# Patient Record
Sex: Male | Born: 1970 | Race: Black or African American | Hispanic: No | Marital: Married | Smoking: Never smoker
Health system: Southern US, Community
[De-identification: ages and names within clinical notes are randomized; demographics above are authoritative.]

## PROBLEM LIST (undated history)

## (undated) DIAGNOSIS — G4733 Obstructive sleep apnea (adult) (pediatric): Secondary | ICD-10-CM

## (undated) DIAGNOSIS — J45909 Unspecified asthma, uncomplicated: Secondary | ICD-10-CM

## (undated) DIAGNOSIS — I10 Essential (primary) hypertension: Secondary | ICD-10-CM

## (undated) DIAGNOSIS — K219 Gastro-esophageal reflux disease without esophagitis: Secondary | ICD-10-CM

## (undated) HISTORY — DX: Gastro-esophageal reflux disease without esophagitis: K21.9

## (undated) HISTORY — DX: Obstructive sleep apnea (adult) (pediatric): G47.33

## (undated) HISTORY — DX: Unspecified asthma, uncomplicated: J45.909

## (undated) HISTORY — PX: MOUTH SURGERY: SHX715

## (undated) HISTORY — PX: HEMORROIDECTOMY: SUR656

---

## 2013-09-24 ENCOUNTER — Ambulatory Visit (INDEPENDENT_AMBULATORY_CARE_PROVIDER_SITE_OTHER): Payer: Managed Care, Other (non HMO)

## 2013-09-24 ENCOUNTER — Ambulatory Visit (INDEPENDENT_AMBULATORY_CARE_PROVIDER_SITE_OTHER): Payer: Managed Care, Other (non HMO) | Admitting: Podiatry

## 2013-09-24 ENCOUNTER — Encounter: Payer: Self-pay | Admitting: Podiatry

## 2013-09-24 VITALS — BP 123/89 | HR 77 | Resp 16 | Ht 67.0 in | Wt 198.0 lb

## 2013-09-24 DIAGNOSIS — M7750 Other enthesopathy of unspecified foot: Secondary | ICD-10-CM

## 2013-09-24 DIAGNOSIS — B351 Tinea unguium: Secondary | ICD-10-CM

## 2013-09-24 DIAGNOSIS — M779 Enthesopathy, unspecified: Secondary | ICD-10-CM

## 2013-09-24 MED ORDER — TRIAMCINOLONE ACETONIDE 10 MG/ML IJ SUSP
10.0000 mg | Freq: Once | INTRAMUSCULAR | Status: AC
  Administered 2013-09-24: 10 mg

## 2013-09-24 MED ORDER — TERBINAFINE HCL 250 MG PO TABS: 250.0000 mg | ORAL_TABLET | Freq: Every day | ORAL | Status: DC

## 2013-09-24 NOTE — Progress Notes (Signed)
   Subjective:    Patient ID: Corey Bates, male    DOB: 04/10/1971, 43 y.o.   MRN: [id number]  HPI Comments: N foot pain L medial arch and lateral foot pain B/L D 2 - 3 years O episodes, this episode began 3 - 4 weeks after mowing C pain - dull A occurs the day after an activity T history of injections, orthotics and Osteo-bio-flex  Foot Pain      Review of Systems  Allergic/Immunologic: Positive for environmental allergies.  All other systems reviewed and are negative.      Objective:   Physical Exam        Assessment & Plan:

## 2013-09-24 NOTE — Progress Notes (Signed)
Subjective:     Patient ID: Corey Bates, male   DOB: 01/06/1971, 43 y.o.   MRN: [id number]  HPI patient presents stating that he has pain in both of his feet after not wearing his orthotics. States he needs new orthotics at this time and he points to the medial side of the left foot and the outside of the right ankle stating both are bothering him quite a bit   Review of Systems     Objective:   Physical Exam Neurovascular status intact with no change in health history and discomfort around posterior tibial insertion left and sinus tarsi right with significant depression of the arch noted on both feet    Assessment:     Tendinitis posterior tib left sinus tarsitis right and also collapse medial longitudinal arch of both feet    Plan:     H&P and x-rays reviewed. I injected the left posterior tib insertion 3 mg Kenalog 5 mg site Marcaine mixture and the right sinus tarsi 3 mg Kenalog and 5 mg Xylocaine Marcaine mixture and instructed on long-term support and scanned for customized orthotic devices patient will be seen back when I return

## 2013-10-23 ENCOUNTER — Other Ambulatory Visit: Payer: Managed Care, Other (non HMO)

## 2013-11-16 ENCOUNTER — Ambulatory Visit: Payer: Managed Care, Other (non HMO) | Admitting: Podiatry

## 2014-01-28 ENCOUNTER — Emergency Department (HOSPITAL_COMMUNITY)
Admission: EM | Admit: 2014-01-28 | Discharge: 2014-01-28 | Disposition: A | Discharge status: Home / Self Care | Payer: Managed Care, Other (non HMO) | Attending: Emergency Medicine | Admitting: Emergency Medicine

## 2014-01-28 ENCOUNTER — Encounter (HOSPITAL_COMMUNITY): Payer: Self-pay | Admitting: Emergency Medicine

## 2014-01-28 ENCOUNTER — Emergency Department (HOSPITAL_COMMUNITY): Payer: Managed Care, Other (non HMO)

## 2014-01-28 DIAGNOSIS — I10 Essential (primary) hypertension: Secondary | ICD-10-CM | POA: Diagnosis not present

## 2014-01-28 DIAGNOSIS — J45909 Unspecified asthma, uncomplicated: Secondary | ICD-10-CM | POA: Diagnosis not present

## 2014-01-28 DIAGNOSIS — R079 Chest pain, unspecified: Secondary | ICD-10-CM

## 2014-01-28 DIAGNOSIS — Z8719 Personal history of other diseases of the digestive system: Secondary | ICD-10-CM | POA: Insufficient documentation

## 2014-01-28 DIAGNOSIS — R9431 Abnormal electrocardiogram [ECG] [EKG]: Secondary | ICD-10-CM | POA: Diagnosis not present

## 2014-01-28 HISTORY — DX: Essential (primary) hypertension: I10

## 2014-01-28 LAB — CBC
HCT: 44.7 % (ref 39.0–52.0)
Hemoglobin: 15.1 g/dL (ref 13.0–17.0)
MCH: 25.6 pg — ABNORMAL LOW (ref 26.0–34.0)
MCHC: 33.8 g/dL (ref 30.0–36.0)
MCV: 75.8 fL — AB (ref 78.0–100.0)
Platelets: 281 10*3/uL (ref 150–400)
RBC: 5.9 MIL/uL — AB (ref 4.22–5.81)
RDW: 13.9 % (ref 11.5–15.5)
WBC: 4.3 10*3/uL (ref 4.0–10.5)

## 2014-01-28 LAB — BASIC METABOLIC PANEL
Anion gap: 13 (ref 5–15)
BUN: 13 mg/dL (ref 6–23)
CALCIUM: 9.8 mg/dL (ref 8.4–10.5)
CO2: 23 meq/L (ref 19–32)
CREATININE: 1 mg/dL (ref 0.50–1.35)
Chloride: 101 mEq/L (ref 96–112)
GFR calc Af Amer: >90 mL/min (ref >90)
GLUCOSE: 89 mg/dL (ref 70–99)
Potassium: 4.1 mEq/L (ref 3.7–5.3)
Sodium: 137 mEq/L (ref 137–147)

## 2014-01-28 LAB — I-STAT TROPONIN, ED: Troponin i, poc: 0 ng/mL (ref 0.00–0.08)

## 2014-01-28 NOTE — ED Provider Notes (Signed)
CSN: [id number]     Arrival date & time 01/28/14  1251 History   First MD Initiated Contact with Patient 01/28/14 1617     Chief Complaint  Patient presents with  . Chest Pain    started yesterday  . Cough    dry cough     (Consider location/radiation/quality/duration/timing/severity/associated sxs/prior Treatment) HPI  Crit Obremski is a 43 y.o. male who presents for constant chest pressure for 4 days, which is worse when deep breathing, and which kept him awake all night, last night. He has a history of GERD, and asthma, but does not usually have chest pain with these. He denies diaphoresis, nausea, vomiting, fever, chills, cough, shortness of breath, back, pain, weakness, or dizziness. He's never had this type of discomfort previously. He saw a doctor in urgent care today was treated with aspirin, and sent here for evaluation. He, states that he previously had an abnormal EKG, when he was evaluated for a syncopal episode while exercising several years ago. He did not see a cardiologist at that time, and does not have any regular medical provider's now.   Past Medical History  Diagnosis Date  . GERD (gastroesophageal reflux disease)   . Asthma   . Hypertension    Past Surgical History  Procedure Laterality Date  . Mouth surgery    . Hemorroidectomy     Family History  Problem Relation Age of Onset  . Asthma Mother   . Hypertension Mother   . Asthma Father    History  Substance Use Topics  . Smoking status: Never Smoker   . Smokeless tobacco: Not on file  . Alcohol Use: Yes    Review of Systems  All other systems reviewed and are negative.     Allergies  Claritin and Tylenol  Home Medications   Prior to Admission medications   Medication Sig Start Date End Date Taking? Authorizing Provider  DOXYLAMINE SUCCINATE, SLEEP, PO Take 1 tablet by mouth at bedtime as needed (sleep.).   Yes Historical Provider, MD  NAPROXEN PO Take 1 tablet by mouth daily as needed  (foot/ankle pain.).   Yes Historical Provider, MD   BP 153/79  Pulse 84  Temp(Src) 98.9 F (37.2 C) (Oral)  Resp 18  Wt 201 lb (91.173 kg)  SpO2 98% Physical Exam  Nursing note and vitals reviewed. Constitutional: He is oriented to person, place, and time. He appears well-developed and well-nourished. No distress.  HENT:  Head: Normocephalic and atraumatic.  Right Ear: External ear normal.  Left Ear: External ear normal.  Eyes: Conjunctivae and EOM are normal. Pupils are equal, round, and reactive to light.  Neck: Normal range of motion and phonation normal. Neck supple.  Cardiovascular: Normal rate, regular rhythm, normal heart sounds and intact distal pulses.   Pulmonary/Chest: Effort normal and breath sounds normal. He exhibits no bony tenderness.  Abdominal: Soft. There is no tenderness.  Musculoskeletal: Normal range of motion.  Neurological: He is alert and oriented to person, place, and time. No cranial nerve deficit or sensory deficit. He exhibits normal muscle tone. Coordination normal.  Skin: Skin is warm, dry and intact.  Psychiatric: He has a normal mood and affect. His behavior is normal. Judgment and thought content normal.    ED Course  Procedures (including critical care time)  Findings discussed with patient, questions answered. There's no indication for further evaluation in the emergency department setting.   Labs Review Labs Reviewed  CBC - Abnormal; Notable for the following:  RBC 5.90 (*)    MCV 75.8 (*)    MCH 25.6 (*)    All other components within normal limits  BASIC METABOLIC PANEL  I-STAT TROPOININ, ED    Imaging Review Dg Chest 2 View  01/28/2014   CLINICAL DATA:  Chest tightness  EXAM: CHEST  2 VIEW  COMPARISON:  None.  FINDINGS: The heart size and mediastinal contours are within normal limits. Both lungs are clear. The visualized skeletal structures are unremarkable.  IMPRESSION: No active cardiopulmonary disease.   Electronically Signed    By: Elige Ko   On: 01/28/2014 14:09     EKG Interpretation   Date/Time:  Thursday January 28 2014 12:58:44 EDT Ventricular Rate:  91 PR Interval:  153 QRS Duration: 84 QT Interval:  364 QTC Calculation: 448 R Axis:   -77 Text Interpretation:  Sinus rhythm Left anterior fascicular block Baseline  wander in lead(s) V1 No old tracing to compare Confirmed by [facility]  MD,  Sherrian Nunnelley ([id number]) on 01/28/2014 4:20:58 PM      MDM   Final diagnoses:  Nonspecific chest pain  Abnormal EKG   Nonspecific chest pain, unlikely to represent coronary ischemia, infarct or impending vascular collapse. 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 Patient recalls having an abnormal EKG, previously.  Nursing Notes Reviewed/ Care Coordinated Applicable Imaging Reviewed Interpretation of Laboratory Data incorporated into ED treatment  The patient appears reasonably screened and/or stabilized for discharge and I doubt any other medical condition or other [facility] requiring further screening, evaluation, or treatment in the ED at this time prior to discharge.  Plan: Home Medications- Zyrtec for possible allergic SX; Home Treatments- rest; return here if the recommended treatment, does not improve the symptoms; Recommended follow up- PCP of choice to establish care in 1 week. Cardiology if desired re: abnormal EKG    Flint Melter, MD 01/28/14 [phone]

## 2014-01-28 NOTE — Discharge Instructions (Signed)
Use the Resource Guide to help you find a Primary Care Doctor. See the Cardiologist, if you desire, to discuss the abnormal EKG.   Chest Pain (Nonspecific) It is often hard to give a specific diagnosis for the cause of chest pain. There is always a chance that your pain could be related to something serious, such as a heart attack or a blood clot in the lungs. You need to follow up with your health care provider for further evaluation. CAUSES   Heartburn.  Pneumonia or bronchitis.  Anxiety or stress.  Inflammation around your heart (pericarditis) or lung (pleuritis or pleurisy).  A blood clot in the lung.  A collapsed lung (pneumothorax). It can develop suddenly on its own (spontaneous pneumothorax) or from trauma to the chest.  Shingles infection (herpes zoster virus). The chest wall is composed of bones, muscles, and cartilage. Any of these can be the source of the pain.  The bones can be bruised by injury.  The muscles or cartilage can be strained by coughing or overwork.  The cartilage can be affected by inflammation and become sore (costochondritis). DIAGNOSIS  Lab tests or other studies may be needed to find the cause of your pain. Your health care provider may have you take a test called an ambulatory electrocardiogram (ECG). An ECG records your heartbeat patterns over a 24-hour period. You may also have other tests, such as:  Transthoracic echocardiogram (TTE). During echocardiography, sound waves are used to evaluate how blood flows through your heart.  Transesophageal echocardiogram (TEE).  Cardiac monitoring. This allows your health care provider to monitor your heart rate and rhythm in real time.  Holter monitor. This is a portable device that records your heartbeat and can help diagnose heart arrhythmias. It allows your health care provider to track your heart activity for several days, if needed.  Stress tests by exercise or by giving medicine that makes the heart  beat faster. TREATMENT   Treatment depends on what may be causing your chest pain. Treatment may include:  Acid blockers for heartburn.  Anti-inflammatory medicine.  Pain medicine for inflammatory conditions.  Antibiotics if an infection is present.  You may be advised to change lifestyle habits. This includes stopping smoking and avoiding alcohol, caffeine, and chocolate.  You may be advised to keep your head raised (elevated) when sleeping. This reduces the chance of acid going backward from your stomach into your esophagus. Most of the time, nonspecific chest pain will improve within 2-3 days with rest and mild pain medicine.  HOME CARE INSTRUCTIONS   If antibiotics were prescribed, take them as directed. Finish them even if you start to feel better.  For the next few days, avoid physical activities that bring on chest pain. Continue physical activities as directed.  Do not use any tobacco products, including cigarettes, chewing tobacco, or electronic cigarettes.  Avoid drinking alcohol.  Only take medicine as directed by your health care provider.  Follow your health care provider's suggestions for further testing if your chest pain does not go away.  Keep any follow-up appointments you made. If you do not go to an appointment, you could develop lasting (chronic) problems with pain. If there is any problem keeping an appointment, call to reschedule. SEEK MEDICAL CARE IF:   Your chest pain does not go away, even after treatment.  You have a rash with blisters on your chest.  You have a fever. SEEK IMMEDIATE MEDICAL CARE IF:   You have increased chest pain or pain  that spreads to your arm, neck, jaw, back, or abdomen.  You have shortness of breath.  You have an increasing cough, or you cough up blood.  You have severe back or abdominal pain.  You feel nauseous or vomit.  You have severe weakness.  You faint.  You have chills. This is an emergency. Do not wait  to see if the pain will go away. Get medical help at once. Call your local emergency services (911 in U.S[place] Do not drive yourself to the hospital. MAKE SURE YOU:   Understand these instructions.  Will watch your condition.  Will get help right away if you are not doing well or get worse. Document Released: 02/14/2005 Document Revised: 05/12/2013 Document Reviewed: 12/11/2007 [facility] Patient Information 2015 [place], [place]. This information is not intended to replace advice given to you by your health care provider. Make sure you discuss any questions you have with your health care provider.    Emergency Department Resource Guide 1) Find a Doctor and Pay Out of Pocket Although you won't have to find out who is covered by your insurance plan, it is a good idea to ask around and get recommendations. You will then need to call the office and see if the doctor you have chosen will accept you as a new patient and what types of options they offer for patients who are self-pay. Some doctors offer discounts or will set up payment plans for their patients who do not have insurance, but you will need to ask so you aren't surprised when you get to your appointment.  2) Contact Your Local Health Department Not all health departments have doctors that can see patients for sick visits, but many do, so it is worth a call to see if yours does. If you don't know where your local health department is, you can check in your phone book. The CDC also has a tool to help you locate your state's health department, and many state websites also have listings of all of their local health departments.  3) Find a Walk-in Clinic If your illness is not likely to be very severe or complicated, you may want to try a walk in clinic. These are popping up all over the country in pharmacies, drugstores, and shopping centers. They're usually staffed by nurse practitioners or physician assistants that have been trained to treat  common illnesses and complaints. They're usually fairly quick and inexpensive. However, if you have serious medical issues or chronic medical problems, these are probably not your best option.  No Primary Care Doctor: - Call Health Connect at  [phone] - they can help you locate a primary care doctor that  accepts your insurance, provides certain services, etc. - Physician Referral Service- [phone]  Chronic Pain Problems: Organization         Address  Phone   Notes  [facility]  [phone] Patients need to be referred by their primary care doctor.   Medication Assistance: Organization         Address  Phone   Notes  [facility] [address] [place] [place], [place] [address] [phone] --Must be a resident of [facility] -- Must have NO insurance coverage whatsoever (no Medicaid/ Medicare, etc.) -- The pt. MUST have a primary care doctor that directs their care regularly and follows them in the community   Med[facility]ist  [phone]   [employer]  [phone]    Agencies  that provide inexpensive medical care: Organization         Address  Phone   Notes  [place]  [phone]   [facility]    [phone]   [facility] [place], [place] [address] [phone]   [place] [address], [place] [phone]   Planned Parenthood    [phone]   [facility]    [phone]   [place] [facility] [address], [place] Phone:  [phone], Fax:  [phone] Hours of Operation:  9 am - 6 pm, M-F.  Also accepts Medicaid/Medicare and self-pay.  [facility] for [address] [place], [address], [place] Phone: [phone], Fax: [phone]. Hours of Operation:  8:30 am - 5:30 pm, M-F.  Also accepts Medicaid and self-pay.  [facility] [address] [place] [address] [place] Phone: [phone]   [place], [place], [place] [phone], Ext. [phone] Mondays & Thursdays: 7-9 AM.  First 15 patients are seen on a first come, first serve basis.    [place] Providers:  Organization         Address  Phone   Notes  [facility] [address], [place] [phone] Also accepts self-pay patients.  [facility] [address], [place]  [phone]   [place], [address], [place] [phone]   [facility] [address], [place] [phone]   Lucianne Lei [address], [place]   [phone] Only accepts [place] Access [place] patients after they have their name applied to their card.   Self-Pay (no insurance) in [facility]:  Organization         Address  Phone   Notes  Sickle Cell Patients, [facility] Internal Medicine [place] [phone]   [facility] [place] [phone]   [facility] [place]  [place], [place], [place] [phone]   [facility]/Dr. Osei-Bonsu  [address] [place] or [address], [place] [phone] Phone number for both [place] and [place] locations is the same.  Urgent Medical and [facility] [address], [place] [phone]   [facility] [address] [place] or [address] [phone]   [facility] [address], [place] [phone], phone; [phone], fax Sees patients 1st and 3rd Saturday of every month.  Must not qualify for public or private insurance (i.e. Medicaid, Medicare, [place] Health Choice, Veterans' Benefits)  Household income should be no more than 200% of the  poverty level The clinic cannot treat you if you are pregnant or think you are pregnant  Sexually transmitted diseases are not treated at the clinic.    Dental Care: Organization         Address  Phone  Notes  [facility] Department of [facility] [place] [phone] Accepts children up to age 79 who are enrolled in [place] or [place]; pregnant women with a Medicaid card; and children who have applied for Medicaid or [place]  Health Choice, but were declined, whose parents can pay a reduced fee at time of service.  [facility] Department of [facility]  [address], [place] [phone] Accepts children up to age 57 who are enrolled in [place] or [place]; pregnant women with a Medicaid card; and children who have applied for Medicaid or [place] Health Choice, but were declined, whose parents can pay a reduced fee at time of service.  [place] Adult Dental Access PROGRAM  [place] [phone] Patients are seen by appointment only. Walk-ins are not accepted. [place] will see patients 35 years of age and older. Monday - Tuesday (8am-5pm) Most Wednesdays (8:30-5pm) $30 per visit, cash only  [facility] Adult Dental Access PROGRAM  [address], [facility] [phone] Patients are seen by appointment only. Walk-ins are not accepted. [place] will see patients 38 years of age and older. One Wednesday Evening (Monthly: Volunteer Based).  $30 per visit, cash only  [place]  [phone] for adults; Children under age 84, call Graduate Pediatric Dentistry at [phone]. Children aged 15-14, please call [phone] to request a pediatric application.  Dental services are provided in all areas of dental care including fillings, crowns and bridges, complete and partial dentures, implants, gum treatment, root canals, and extractions.  Preventive care is also provided. Treatment is provided to both adults and children. Patients are selected via a lottery and there is often a waiting list.   [facility] [address] [place]  [phone] [email or web address]   [facility] [address] [place], [place] [phone], Ext. [phone] Second and Fourth Thursday of each month, opens at 6:30 AM; Clinic ends at 9 AM.  Patients are seen on a first-come first-served basis, and a limited number are seen during each clinic.   [facility]  [address] Hillard Danker [place], [place] [phone]   Eligibility Requirements You must have lived in [place], [place], or [place] counties for at least the last three months.   You cannot be eligible for state or federal sponsored [employer], including [employer], [place], or [employer].   You generally cannot be eligible for healthcare insurance through your employer.    How to apply: Eligibility screenings are held every Tuesday and Wednesday afternoon from 1:00 pm until 4:00 pm. You do not need an appointment for the interview!  [facility] [address] [place], [place]   [place]  [place] Department  [place]  [phone]    Behavioral Health Resources in the Community: Intensive Outpatient Programs Organization         Address  Phone  Notes  [place]. [address], [place], [place] [phone]   [facility] Outpatient [address] [place], [place]   ADS: Alcohol & Drug Svcs [address], [place], [place]   [place] [address],  [place], [place] or [phone]   Substance Abuse Resources Organization         Address  Phone  Notes  Alcohol and Drug Services  [phone]   Addiction  [facility]  [phone]   [facility] [place]  [phone]   Chinita Pester  949-738-2222   Residential & Outpatient Substance Abuse Program  [phone]  Psychological Services Organization         Address  Phone  Notes  [facility] Behavioral Health  [phone]   [facility] Services  [phone]   [facility] Mental Health [phone] [address] [place] [phone] or [phone]    Mobile Crisis Teams Organization         Address  Phone  Notes  [facility], Mobile Crisis Care Unit  [phone]   [facility]  [address] [place], [place] [phone]   Doristine Locks [address] [place] [phone]    Self-Help/Support Groups Organization         Address  Phone             Notes  [facility] of [place] - variety of support groups  336- I7437963 Call for more information  Narcotics Anonymous (NA), [facility]aring [facility] [address], [employer] [place]  2 meetings at this location   Statistician         Address  Phone  Notes  [facility] [address],    [place]  [phone]   [facility]  [address], [place] [address], [place], [place] [phone]   [facility] [address] [place], [place] [phone] Admissions: 8am-3pm M-F  [facility] [address],    [place], [place] [phone]   [facility] [address] [place], [place], [place] [phone]   [facility] [address],  [place], [place] [phone]   [facility] - Intensive Outpatient [address], [place], [place] [phone]   [facility] (Addiction [facility] [address] [place]  [place], [place] [phone] or [phone]   [facility] Services ([facility]) [address], [place], [place] [phone] Accepts Medicaid  [facility] [place] [address],  [place]  [phone] Substance Abuse/Addiction Treatment   [facility] Organization         Address  Phone  Notes  [facility]  [phone]   Angie Fava, PhD [address] Ervin Knack [place], [place]   [phone] or 7782613687   [facility] Behavioral   [address] [place], [place] [phone]   [facility] [address] [place], [place] [phone] Insurance/Medicaid/sponsorship through [facility] and Families [address]                                    [place], [place] [phone] Therapy/tele-psych/case  [facility] [address], [place] [phone]    Dr. Lolly Mustache  [phone]   [facility]  [employer] [facility] Dept. 1) [address] [place] 2) [address] [place] 3)  [address], [place] [phone]  [phone]   [facility] Child Abuse Hotline 325 103 9208 or [phone] (After Hours)

## 2014-01-28 NOTE — ED Notes (Signed)
Pt report chest pressure, midsternal, radiating to l/shoulder since last night. Pt stayed up all night due to discomfort. Seen by PCP-B. Knott PA-[facility] and referred to ED for evaluation. Given ASA  in office. Pt declined ambulance and drove self

## 2014-06-09 ENCOUNTER — Ambulatory Visit: Payer: BLUE CROSS/BLUE SHIELD | Admitting: *Deleted

## 2014-06-09 DIAGNOSIS — M779 Enthesopathy, unspecified: Secondary | ICD-10-CM

## 2014-06-09 NOTE — Progress Notes (Signed)
PICKING UP MY ORTHOTICS  

## 2014-06-09 NOTE — Patient Instructions (Signed)

## 2014-10-28 ENCOUNTER — Encounter (HOSPITAL_COMMUNITY): Payer: Self-pay

## 2014-10-28 ENCOUNTER — Emergency Department (HOSPITAL_COMMUNITY)
Admission: EM | Admit: 2014-10-28 | Discharge: 2014-10-29 | Disposition: A | Discharge status: Home / Self Care | Payer: BLUE CROSS/BLUE SHIELD | Attending: Emergency Medicine | Admitting: Emergency Medicine

## 2014-10-28 ENCOUNTER — Emergency Department (HOSPITAL_COMMUNITY): Payer: BLUE CROSS/BLUE SHIELD

## 2014-10-28 DIAGNOSIS — I10 Essential (primary) hypertension: Secondary | ICD-10-CM | POA: Insufficient documentation

## 2014-10-28 DIAGNOSIS — R0789 Other chest pain: Secondary | ICD-10-CM | POA: Diagnosis not present

## 2014-10-28 DIAGNOSIS — IMO0001 Reserved for inherently not codable concepts without codable children: Secondary | ICD-10-CM

## 2014-10-28 DIAGNOSIS — Z79899 Other long term (current) drug therapy: Secondary | ICD-10-CM | POA: Insufficient documentation

## 2014-10-28 DIAGNOSIS — J45909 Unspecified asthma, uncomplicated: Secondary | ICD-10-CM | POA: Insufficient documentation

## 2014-10-28 DIAGNOSIS — R079 Chest pain, unspecified: Secondary | ICD-10-CM | POA: Diagnosis present

## 2014-10-28 DIAGNOSIS — K219 Gastro-esophageal reflux disease without esophagitis: Secondary | ICD-10-CM | POA: Diagnosis not present

## 2014-10-28 NOTE — ED Notes (Signed)
Pt presents with c/o central chest pain that started last night. Pt reports the pain eased off during the day but became worse approx one hour ago. Pt reports his left arm is also tingling and feels really cold. No shortness of breath noted at this time.

## 2014-10-29 LAB — BASIC METABOLIC PANEL
Anion gap: 7 (ref 5–15)
BUN: 18 mg/dL (ref 6–20)
CALCIUM: 9.2 mg/dL (ref 8.9–10.3)
CO2: 26 mmol/L (ref 22–32)
CREATININE: 1.29 mg/dL — AB (ref 0.61–1.24)
Chloride: 105 mmol/L (ref 101–111)
GFR calc non Af Amer: >60 mL/min (ref >60)
GLUCOSE: 95 mg/dL (ref 65–99)
POTASSIUM: 4.2 mmol/L (ref 3.5–5.1)
SODIUM: 138 mmol/L (ref 135–145)

## 2014-10-29 LAB — CBC
HCT: 45.3 % (ref 39.0–52.0)
Hemoglobin: 14.6 g/dL (ref 13.0–17.0)
MCH: 25.2 pg — ABNORMAL LOW (ref 26.0–34.0)
MCHC: 32.2 g/dL (ref 30.0–36.0)
MCV: 78.1 fL (ref 78.0–100.0)
Platelets: 244 10*3/uL (ref 150–400)
RBC: 5.8 MIL/uL (ref 4.22–5.81)
RDW: 14.3 % (ref 11.5–15.5)
WBC: 4.6 10*3/uL (ref 4.0–10.5)

## 2014-10-29 LAB — I-STAT TROPONIN, ED: Troponin i, poc: 0 ng/mL (ref 0.00–0.08)

## 2014-10-29 MED ORDER — ASPIRIN 81 MG PO CHEW
324.0000 mg | CHEWABLE_TABLET | Freq: Once | ORAL | Status: AC
Start: 2014-10-29 — End: 2014-10-29
  Administered 2014-10-29: 324 mg via ORAL
  Filled 2014-10-29: qty 4

## 2014-10-29 MED ORDER — GI COCKTAIL ~~LOC~~
30.0000 mL | Freq: Once | ORAL | Status: AC
  Administered 2014-10-29: 30 mL via ORAL
  Filled 2014-10-29: qty 30

## 2014-10-29 NOTE — ED Provider Notes (Signed)
CSN: [id number]     Arrival date & time 10/28/14  2336 History   First MD Initiated Contact with Patient 10/28/14 2358     Chief Complaint  Patient presents with  . Chest Pain     (Consider location/radiation/quality/duration/timing/severity/associated sxs/prior Treatment) HPI  This a 44 year old male with history of GERD who presents with chest pain. Patient reports 3 day history of chest pain on and off. He reports that she pressure-like and nonradiating. Currently his pain is 8 out of 10. It is worse at night and when laying flat. Denies any exertional component. Patient denies history of hypertension but has hypertension documented in his chart and his initial blood pressure is 153/86. Patient reports tingling in the left arm as well. Denies any shortness of breath, cough, or fever. Denies history of diabetes or early family history of heart disease.  Past Medical History  Diagnosis Date  . GERD (gastroesophageal reflux disease)   . Asthma   . Hypertension    Past Surgical History  Procedure Laterality Date  . Mouth surgery    . Hemorroidectomy     Family History  Problem Relation Age of Onset  . Asthma Mother   . Hypertension Mother   . Asthma Father    History  Substance Use Topics  . Smoking status: Never Smoker   . Smokeless tobacco: Not on file  . Alcohol Use: Yes    Review of Systems  Constitutional: Negative.  Negative for fever.  Respiratory: Positive for chest tightness. Negative for shortness of breath.   Cardiovascular: Positive for chest pain. Negative for leg swelling.  Gastrointestinal: Negative.  Negative for nausea, vomiting and abdominal pain.  Genitourinary: Negative.  Negative for dysuria.  Musculoskeletal: Negative for back pain.  Neurological: Negative for headaches.  All other systems reviewed and are negative.     Allergies  Claritin and Tylenol  Home Medications   Prior to Admission medications   Medication Sig Start Date End Date  Taking? Authorizing Provider  acetaminophen (TYLENOL) 500 MG tablet Take 500 mg by mouth every 6 (six) hours as needed for mild pain.   Yes Historical Provider, MD  albuterol (PROAIR HFA) 108 (90 BASE) MCG/ACT inhaler Inhale 2 puffs into the lungs every 4 (four) hours as needed. 04/30/14  Yes Historical Provider, MD  DOXYLAMINE SUCCINATE, SLEEP, PO Take 1 tablet by mouth at bedtime as needed (sleep.).   Yes Historical Provider, MD  NAPROXEN PO Take 1 tablet by mouth daily as needed (foot/ankle pain.).   Yes Historical Provider, MD  omeprazole (PRILOSEC) 20 MG capsule Take 1 capsule by mouth daily. 08/06/14  Yes Historical Provider, MD   BP 130/91 mmHg  Pulse 77  Temp(Src) 98.1 F (36.7 C) (Oral)  Resp 18  SpO2 97% Physical Exam  Constitutional: He is oriented to person, place, and time. He appears well-developed and well-nourished. No distress.  HENT:  Head: Normocephalic and atraumatic.  Eyes: Pupils are equal, round, and reactive to light.  Neck: Neck supple.  Cardiovascular: Normal rate, regular rhythm and normal heart sounds.   No murmur heard. Pulmonary/Chest: Effort normal and breath sounds normal. No respiratory distress. He has no wheezes. He exhibits no tenderness.  Abdominal: Soft. Bowel sounds are normal. There is no tenderness. There is no rebound.  Musculoskeletal: He exhibits no edema.  Lymphadenopathy:    He has no cervical adenopathy.  Neurological: He is alert and oriented to person, place, and time.  Skin: Skin is warm and dry.  Psychiatric: He  has a normal mood and affect.  Nursing note and vitals reviewed.   ED Course  Procedures (including critical care time) Labs Review Labs Reviewed  CBC - Abnormal; Notable for the following:    MCH 25.2 (*)    All other components within normal limits  BASIC METABOLIC PANEL - Abnormal; Notable for the following:    Creatinine, Ser 1.29 (*)    All other components within normal limits  I-STAT TROPOININ, ED    Imaging  Review Dg Chest 2 View  10/29/2014   CLINICAL DATA:  Midsternal chest pain, onset last night. The pain lessened during the day but became worse 1 hour ago.  EXAM: CHEST  2 VIEW  COMPARISON:  01/28/2014  FINDINGS: The heart size and mediastinal contours are within normal limits. Both lungs are clear. The visualized skeletal structures are unremarkable.  IMPRESSION: No active cardiopulmonary disease.   Electronically Signed   By: Ellery Plunk M.D.   On: 10/29/2014 00:16     EKG Interpretation   Date/Time:  Thursday October 28 2014 23:41:49 EDT Ventricular Rate:  99 PR Interval:  173 QRS Duration: 80 QT Interval:  351 QTC Calculation: 450 R Axis:   -58 Text Interpretation:  Sinus rhythm Probable left atrial enlargement LAD,  consider left anterior fascicular block Confirmed by HORTON  MD, COURTNEY  ([id number]) on 10/29/2014 12:37:00 AM      MDM   Final diagnoses:  Other chest pain  Reflux    Patient presents with chest pain. Somewhat atypical for ACS. Reports that he had a stress test in April that was negative. Only risk factor is hypertension. EKG is nonischemic. Chest x-ray shows no evidence of pneumonia or pneumothorax. Symptoms somewhat suggestive of reflux and patient has a history of the same. Pain is worse with lying flat and at night. Patient given GI cocktail. Workup largely reassuring. Patient is PE RC negative. Patient given GI cocktail with improvement of symptoms. He does not take his Prilosec daily. Instructed patient that he needs to take his Prilosec daily.  After history, exam, and medical workup I feel the patient has been appropriately medically screened and is safe for discharge home. Pertinent diagnoses were discussed with the patient. Patient was given return precautions.     Shon Baton, MD 10/29/14 [phone]

## 2014-10-29 NOTE — Discharge Instructions (Signed)
Chest Pain (Nonspecific) °It is often hard to give a specific diagnosis for the cause of chest pain. There is always a chance that your pain could be related to something serious, such as a heart attack or a blood clot in the lungs. You need to follow up with your health care provider for further evaluation. °CAUSES  °· Heartburn. °· Pneumonia or bronchitis. °· Anxiety or stress. °· Inflammation around your heart (pericarditis) or lung (pleuritis or pleurisy). °· A blood clot in the lung. °· A collapsed lung (pneumothorax). It can develop suddenly on its own (spontaneous pneumothorax) or from trauma to the chest. °· Shingles infection (herpes zoster virus). °The chest wall is composed of bones, muscles, and cartilage. Any of these can be the source of the pain. °· The bones can be bruised by injury. °· The muscles or cartilage can be strained by coughing or overwork. °· The cartilage can be affected by inflammation and become sore (costochondritis). °DIAGNOSIS  °Lab tests or other studies may be needed to find the cause of your pain. Your health care provider may have you take a test called an ambulatory electrocardiogram (ECG). An ECG records your heartbeat patterns over a 24-hour period. You may also have other tests, such as: °· Transthoracic echocardiogram (TTE). During echocardiography, sound waves are used to evaluate how blood flows through your heart. °· Transesophageal echocardiogram (TEE). °· Cardiac monitoring. This allows your health care provider to monitor your heart rate and rhythm in real time. °· Holter monitor. This is a portable device that records your heartbeat and can help diagnose heart arrhythmias. It allows your health care provider to track your heart activity for several days, if needed. °· Stress tests by exercise or by giving medicine that makes the heart beat faster. °TREATMENT  °· Treatment depends on what may be causing your chest pain. Treatment may include: °· Acid blockers for  heartburn. °· Anti-inflammatory medicine. °· Pain medicine for inflammatory conditions. °· Antibiotics if an infection is present. °· You may be advised to change lifestyle habits. This includes stopping smoking and avoiding alcohol, caffeine, and chocolate. °· You may be advised to keep your head raised (elevated) when sleeping. This reduces the chance of acid going backward from your stomach into your esophagus. °Most of the time, nonspecific chest pain will improve within 2-3 days with rest and mild pain medicine.  °HOME CARE INSTRUCTIONS  °· If antibiotics were prescribed, take them as directed. Finish them even if you start to feel better. °· For the next few days, avoid physical activities that bring on chest pain. Continue physical activities as directed. °· Do not use any tobacco products, including cigarettes, chewing tobacco, or electronic cigarettes. °· Avoid drinking alcohol. °· Only take medicine as directed by your health care provider. °· Follow your health care provider's suggestions for further testing if your chest pain does not go away. °· Keep any follow-up appointments you made. If you do not go to an appointment, you could develop lasting (chronic) problems with pain. If there is any problem keeping an appointment, call to reschedule. °SEEK MEDICAL CARE IF:  °· Your chest pain does not go away, even after treatment. °· You have a rash with blisters on your chest. °· You have a fever. °SEEK IMMEDIATE MEDICAL CARE IF:  °· You have increased chest pain or pain that spreads to your arm, neck, jaw, back, or abdomen. °· You have shortness of breath. °· You have an increasing cough, or you cough   up blood. °· You have severe back or abdominal pain. °· You feel nauseous or vomit. °· You have severe weakness. °· You faint. °· You have chills. °This is an emergency. Do not wait to see if the pain will go away. Get medical help at once. Call your local emergency services (911 in [place] Do not drive  yourself to the hospital. °MAKE SURE YOU:  °· Understand these instructions. °· Will watch your condition. °· Will get help right away if you are not doing well or get worse. °Document Released: 02/14/2005 Document Revised: 05/12/2013 Document Reviewed: 12/11/2007 °ExitCare® Patient Information ©2015 [employer]. This information is not intended to replace advice given to you by your health care provider. Make sure you discuss any questions you have with your health care provider. °Gastroesophageal Reflux Disease, Adult °Gastroesophageal reflux disease (GERD) happens when acid from your stomach flows up into the esophagus. When acid comes in contact with the esophagus, the acid causes soreness (inflammation) in the esophagus. Over time, GERD may create small holes (ulcers) in the lining of the esophagus. °CAUSES  °· Increased body weight. This puts pressure on the stomach, making acid rise from the stomach into the esophagus. °· Smoking. This increases acid production in the stomach. °· Drinking alcohol. This causes decreased pressure in the lower esophageal sphincter (valve or ring of muscle between the esophagus and stomach), allowing acid from the stomach into the esophagus. °· Late evening meals and a full stomach. This increases pressure and acid production in the stomach. °· A malformed lower esophageal sphincter. °Sometimes, no cause is found. °SYMPTOMS  °· Burning pain in the lower part of the mid-chest behind the breastbone and in the mid-stomach area. This may occur twice a week or more often. °· Trouble swallowing. °· Sore throat. °· Dry cough. °· Asthma-like symptoms including chest tightness, shortness of breath, or wheezing. °DIAGNOSIS  °Your caregiver may be able to diagnose GERD based on your symptoms. In some cases, X-rays and other tests may be done to check for complications or to check the condition of your stomach and esophagus. °TREATMENT  °Your caregiver may recommend over-the-counter or  prescription medicines to help decrease acid production. Ask your caregiver before starting or adding any new medicines.  °HOME CARE INSTRUCTIONS  °· Change the factors that you can control. Ask your caregiver for guidance concerning weight loss, quitting smoking, and alcohol consumption. °· Avoid foods and drinks that make your symptoms worse, such as: °¨ Caffeine or alcoholic drinks. °¨ Chocolate. °¨ Peppermint or mint flavorings. °¨ Garlic and onions. °¨ Spicy foods. °¨ Citrus fruits, such as oranges, lemons, or limes. °¨ Tomato-based foods such as sauce, chili, salsa, and pizza. °¨ Fried and fatty foods. °· Avoid lying down for the 3 hours prior to your bedtime or prior to taking a nap. °· Eat small, frequent meals instead of large meals. °· Wear loose-fitting clothing. Do not wear anything tight around your waist that causes pressure on your stomach. °· Raise the head of your bed 6 to 8 inches with wood blocks to help you sleep. Extra pillows will not help. °· Only take over-the-counter or prescription medicines for pain, discomfort, or fever as directed by your caregiver. °· Do not take aspirin, ibuprofen, or other nonsteroidal anti-inflammatory drugs (NSAIDs). °SEEK IMMEDIATE MEDICAL CARE IF:  °· You have pain in your arms, neck, jaw, teeth, or back. °· Your pain increases or changes in intensity or duration. °· You develop nausea, vomiting, or sweating (diaphoresis). °·   You develop shortness of breath, or you faint. °· Your vomit is green, yellow, black, or looks like coffee grounds or blood. °· Your stool is red, bloody, or black. °These symptoms could be signs of other problems, such as heart disease, gastric bleeding, or esophageal bleeding. °MAKE SURE YOU:  °· Understand these instructions. °· Will watch your condition. °· Will get help right away if you are not doing well or get worse. °Document Released: 02/14/2005 Document Revised: 07/30/2011 Document Reviewed: 11/24/2010 °ExitCare® Patient  Information ©2015 [employer]. This information is not intended to replace advice given to you by your health care provider. Make sure you discuss any questions you have with your health care provider. ° °

## 2014-12-29 ENCOUNTER — Encounter: Payer: Self-pay | Admitting: Podiatry

## 2014-12-29 ENCOUNTER — Ambulatory Visit (INDEPENDENT_AMBULATORY_CARE_PROVIDER_SITE_OTHER): Payer: BLUE CROSS/BLUE SHIELD | Admitting: Podiatry

## 2014-12-29 VITALS — BP 136/88 | HR 83 | Resp 16

## 2014-12-29 DIAGNOSIS — M779 Enthesopathy, unspecified: Secondary | ICD-10-CM

## 2014-12-29 MED ORDER — TRIAMCINOLONE ACETONIDE 10 MG/ML IJ SUSP
10.0000 mg | Freq: Once | INTRAMUSCULAR | Status: AC
  Administered 2014-12-29: 10 mg

## 2014-12-29 NOTE — Progress Notes (Signed)
Subjective:     Patient ID: Corey Bates, male   DOB: 30-Mar-1971, 44 y.o.   MRN: [id number]  HPI patient states I developed pain in the joints of both feet like I had last year and I would like to get them worked on   Review of Systems     Objective:   Physical Exam  Neurovascular status intact muscle strength adequate with discomfort in the sinus tarsi bilateral with inflammation fluid buildup upon palpation    Assessment:     Sinus tarsitis bilateral with inflammation and fluid within the sinus tarsi    Plan:     Reviewed condition and injected the sinus tarsi bilateral 3 mg Kenalog 5 mg Xylocaine which was tolerated well

## 2014-12-29 NOTE — Patient Instructions (Signed)

## 2015-08-22 ENCOUNTER — Telehealth: Payer: Self-pay | Admitting: *Deleted

## 2015-08-22 NOTE — Telephone Encounter (Signed)
Pt states he is an established pt with Dr. Charlsie Merlesegal and he did work this weekend and he is in terrible pain.  I transferred to schedulers to get in to be seen asap.

## 2015-08-24 ENCOUNTER — Encounter: Payer: Self-pay | Admitting: Podiatry

## 2015-08-24 ENCOUNTER — Ambulatory Visit (INDEPENDENT_AMBULATORY_CARE_PROVIDER_SITE_OTHER): Payer: BLUE CROSS/BLUE SHIELD

## 2015-08-24 ENCOUNTER — Ambulatory Visit (INDEPENDENT_AMBULATORY_CARE_PROVIDER_SITE_OTHER): Payer: BLUE CROSS/BLUE SHIELD | Admitting: Podiatry

## 2015-08-24 VITALS — BP 149/97 | HR 87 | Resp 16

## 2015-08-24 DIAGNOSIS — M779 Enthesopathy, unspecified: Secondary | ICD-10-CM | POA: Diagnosis not present

## 2015-08-24 MED ORDER — TRIAMCINOLONE ACETONIDE 10 MG/ML IJ SUSP
10.0000 mg | Freq: Once | INTRAMUSCULAR | Status: AC
  Administered 2015-08-24: 10 mg

## 2015-08-24 NOTE — Progress Notes (Signed)
Subjective:     Patient ID: Corey Bates, male   DOB: 12/21/1970, 45 y.o.   MRN: [id number]  HPI patient presents stating I am getting pain in my joints again and my orthotics are several years old and my feet are very flat   Review of Systems     Objective:   Physical Exam Neurovascular status intact with inflammatory capsulitis of the sinus tarsi bilateral with fluid buildup and significant depression of the arch bilateral with orthotics which are starting to wear out after several years of continuous usage    Assessment:     Sinus tarsitis bilateral with inflammatory changes    Plan:     H&P and x-rays reviewed with patient. Injected the sinus tarsi bilateral 3 mg Kenalog 5 mg Xylocaine and advised on ice therapy and scanned for custom orthotics to lift the arch and take pressure off the plantar arch. Reappoint when orthotics returned  Reviewed indicate significant flatness of the arch bilateral with no indications of diastases injury and mild lateral arthritis noted bilateral

## 2015-09-14 ENCOUNTER — Ambulatory Visit (INDEPENDENT_AMBULATORY_CARE_PROVIDER_SITE_OTHER): Payer: BLUE CROSS/BLUE SHIELD | Admitting: Podiatry

## 2015-09-14 DIAGNOSIS — M779 Enthesopathy, unspecified: Secondary | ICD-10-CM

## 2015-09-14 NOTE — Patient Instructions (Signed)

## 2015-09-20 DIAGNOSIS — G4700 Insomnia, unspecified: Secondary | ICD-10-CM | POA: Insufficient documentation

## 2017-12-12 ENCOUNTER — Encounter: Payer: Self-pay | Admitting: Neurology

## 2018-02-07 NOTE — Progress Notes (Signed)
NEUROLOGY CONSULTATION NOTE  Corey Bates MRN: [id number] DOB: 1970/08/04  Referring provider: Devra Dopp, MD Primary care provider: Devra Dopp, MD  Reason for consult:  headaches  HISTORY OF PRESENT ILLNESS: Corey Bates is a 47 year old right-handed male with HTN, asthma and GERD who presents for headaches.  He is accompanied by his wife who supplements history.  History supplemented by referring provider's note.  Onset:  January 2019.  Thought it was stress-related.  He had a headache lasting 2 weeks in July.  Since then, they are infrequent Location:  Right posterior parietal Quality:  pressure Intensity:  dull.  He denies new headache, thunderclap headache or severe headache that wakes him from sleep. Aura:  no Prodrome:  no Postdrome:  no Associated symptoms:  Scalp soreness in the right parietal region.  He denies associated nausea, vomiting, photophobia, phonophobia, visual disturbance, and unilateral numbness or weakness. Duration:  A few hours Frequency:  1 day a week Treatment regimen:  Red hibiscus tea Frequency of abortive medication: None. Triggers:  Hot weather Exacerbating factors:  Hot weather, emotional stress Relieving factors:  rest Activity:  Needs to lay down  Current NSAIDS:  Naproxen (for feet pain) Current analgesics:  Tylenol Current triptans:  no Current ergotamine:  no Current anti-emetic:  no Current muscle relaxants:  no Current anti-anxiolytic:  no Current sleep aide:  no Current Antihypertensive medications:  no Current Antidepressant medications:  no Current Anticonvulsant medications:  no Current anti-CGRP:  no Current Vitamins/Herbal/Supplements:  no Current Antihistamines/Decongestants:  no Other therapy:  Red hibiscus tea  Past NSAIDS:  no Past analgesics:  Excedrin Migraine (did not feel well on it) Past abortive triptans:  no Past abortive ergotamine:  no Past muscle relaxants:  no Past anti-emetic:  no Past  antihypertensive medications:  no Past antidepressant medications:  no Past anticonvulsant medications:  no Past anti-CGRP:  no Past vitamins/Herbal/Supplements:  no Past antihistamines/decongestants:  no Other past therapies:  no  Caffeine:  At least 2 cups coffee daily Alcohol:  occasional Smoker:  no Diet:  Does not drink enough water, soda once a week Exercise:  no Depression:  no Anxiety:  yes Other pain:  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  Has a new dog that contributes to this.  Snores.  Daytime drowsiness. Family history of headache:  No  PAST MEDICAL HISTORY: Past Medical History:  Diagnosis Date  . Asthma   . GERD (gastroesophageal reflux disease)   . Hypertension     PAST SURGICAL HISTORY: Past Surgical History:  Procedure Laterality Date  . HEMORROIDECTOMY    . MOUTH SURGERY      MEDICATIONS: Current Outpatient Medications on File Prior to Visit  Medication Sig Dispense Refill  . acetaminophen (TYLENOL) 500 MG tablet Take 500 mg by mouth every 6 (six) hours as needed for mild pain.    Marland Kitchen albuterol (PROAIR HFA) 108 (90 BASE) MCG/ACT inhaler Inhale 2 puffs into the lungs every 4 (four) hours as needed.    Marland Kitchen DOXYLAMINE SUCCINATE, SLEEP, PO Take 1 tablet by mouth at bedtime as needed (sleep.).    Marland Kitchen omeprazole (PRILOSEC) 20 MG capsule Take 1 capsule by mouth daily.  1   No current facility-administered medications on file prior to visit.     ALLERGIES: Allergies  Allergen Reactions  . Claritin [Loratadine] Palpitations    tachnycardia  . Tylenol [Acetaminophen] Rash    FAMILY HISTORY: Family History  Problem Relation Age of Onset  . Asthma Mother   .  Hypertension Mother   . Asthma Father    SOCIAL HISTORY: Social History   Socioeconomic History  . Marital status: Married    Spouse name: Not on file  . Number of children: Not on file  . Years of education: Not on file  . Highest education level: Not on file  Occupational History  . Not  on file  Social Needs  . Financial resource strain: Not on file  . Food insecurity:    Worry: Not on file    Inability: Not on file  . Transportation needs:    Medical: Not on file    Non-medical: Not on file  Tobacco Use  . Smoking status: Never Smoker  Substance and Sexual Activity  . Alcohol use: Yes  . Drug use: Not on file  . Sexual activity: Not on file  Lifestyle  . Physical activity:    Days per week: Not on file    Minutes per session: Not on file  . Stress: Not on file  Relationships  . Social connections:    Talks on phone: Not on file    Gets together: Not on file    Attends religious service: Not on file    Active member of club or organization: Not on file    Attends meetings of clubs or organizations: Not on file    Relationship status: Not on file  . Intimate partner violence:    Fear of current or ex partner: Not on file    Emotionally abused: Not on file    Physically abused: Not on file    Forced sexual activity: Not on file  Other Topics Concern  . Not on file  Social History Narrative  . Not on file    REVIEW OF SYSTEMS: Constitutional: No fevers, chills, or sweats, no generalized fatigue, change in appetite Eyes: No visual changes, double vision, eye pain Ear, nose and throat: No hearing loss, ear pain, nasal congestion, sore throat Cardiovascular: No chest pain, palpitations Respiratory:  No shortness of breath at rest or with exertion, wheezes GastrointestinaI: No nausea, vomiting, diarrhea, abdominal pain, fecal incontinence Genitourinary:  No dysuria, urinary retention or frequency Musculoskeletal:  No neck pain, back pain Integumentary: No rash, pruritus, skin lesions Neurological: as above Psychiatric: No depression, insomnia, anxiety Endocrine: No palpitations, fatigue, diaphoresis, mood swings, change in appetite, change in weight, increased thirst Hematologic/Lymphatic:  No purpura, petechiae. Allergic/Immunologic: no itchy/runny  eyes, nasal congestion, recent allergic reactions, rashes  PHYSICAL EXAM: Blood pressure (!) 128/96, pulse 78, height 5\' 8"  (1.727 m), weight 211 lb (95.7 kg), SpO2 98 %. General: No acute distress.  Patient appears well-groomed.  Head:  Normocephalic/atraumatic Eyes:  fundi examined but not visualized Neck: supple, no paraspinal tenderness, full range of motion Back: No paraspinal tenderness Heart: regular rate and rhythm Lungs: Clear to auscultation bilaterally. Vascular: No carotid bruits. Neurological Exam: Mental status: alert and oriented to person, place, and time, recent and remote memory intact, fund of knowledge intact, attention and concentration intact, speech fluent and not dysarthric, language intact. Cranial nerves: CN I: not tested CN II: pupils equal, round and reactive to light, visual fields intact CN III, IV, VI:  full range of motion, no nystagmus, no ptosis CN V: facial sensation intact CN VII: upper and lower face symmetric CN VIII: hearing intact CN IX, X: gag intact, uvula midline CN XI: sternocleidomastoid and trapezius muscles intact CN XII: tongue midline Bulk & Tone: normal, no fasciculations. Motor:  5/5 throughout  Sensation:  Temperature and vibration sensation intact. Deep Tendon Reflexes:  2+ throughout, toes downgoing.  Finger to nose testing:  Without dysmetria.  Heel to shin:  Without dysmetria.  Gait:  Normal station and stride.  Able to turn and tandem walk. Romberg negative.  IMPRESSION: Headache (tension-type vs migraine), not intractable  PLAN: 1.  Defer preventative medication for now. 2.  Abortive therapy with ibuprofen up to 800mg  3.  Limit use of pain relievers to no more than 2 days out of week to prevent risk of rebound or medication-overuse headache. 4.  Keep headache diary 5.  Refer to sleep medicine to evaluate for OSA. 6.  Exercise, increase water intake, caffeine cessation 7.  Follow up in 3 to 4 months.  Thank you for  allowing me to take part in the care of this patient.  45 minutes spent face to face with patient, over 50% spent discussing diagnosis and management.  Shon Millet, DO  CC:  Devra Dopp, MD

## 2018-02-10 ENCOUNTER — Encounter: Payer: Self-pay | Admitting: Neurology

## 2018-02-10 ENCOUNTER — Ambulatory Visit: Payer: BLUE CROSS/BLUE SHIELD | Admitting: Neurology

## 2018-02-10 VITALS — BP 128/96 | HR 78 | Ht 68.0 in | Wt 211.0 lb

## 2018-02-10 DIAGNOSIS — Z72820 Sleep deprivation: Secondary | ICD-10-CM

## 2018-02-10 DIAGNOSIS — R51 Headache: Secondary | ICD-10-CM | POA: Diagnosis not present

## 2018-02-10 DIAGNOSIS — R519 Headache, unspecified: Secondary | ICD-10-CM

## 2018-02-10 NOTE — Addendum Note (Signed)
Addended by: Dorthy CoolerBURNS, Pascual Mantel J on: 02/10/2018 01:35 PM   Modules accepted: Orders

## 2018-02-10 NOTE — Patient Instructions (Signed)
  1.  I will refer you to sleep medicine to evaluate for sleep apnea 2.  Take ibuprofen 800mg  or naproxen 500mg  to treat acute headaches.  Take at earliest onset of headache 3.  Limit use of pain relievers to no more than 2 days out of the week.  These medications include acetaminophen, ibuprofen, triptans and narcotics.  This will help reduce risk of rebound headaches. 4.  Be aware of common food triggers such as processed sweets, processed foods with nitrites (such as deli meat, hot dogs, sausages), foods with MSG, alcohol (such as wine), chocolate, certain cheeses, certain fruits (dried fruits, bananas, some citrus fruit), vinegar, diet soda. 4.  Avoid caffeine 5.  Routine exercise 6.  Proper sleep hygiene 7.  Stay adequately hydrated with water 8.  Keep a headache diary. 9.  Maintain proper stress management. 10.  Do not skip meals. 11.  Consider supplements:  Magnesium citrate 400mg  to 600mg  daily, riboflavin 400mg , Coenzyme Q 10 100mg  three times daily 12.  Follow up in 3 to 4 months

## 2018-03-19 ENCOUNTER — Encounter: Payer: Self-pay | Admitting: Pulmonary Disease

## 2018-03-19 ENCOUNTER — Ambulatory Visit (INDEPENDENT_AMBULATORY_CARE_PROVIDER_SITE_OTHER): Payer: BLUE CROSS/BLUE SHIELD | Admitting: Pulmonary Disease

## 2018-03-19 VITALS — BP 128/82 | HR 73 | Ht 68.0 in | Wt 216.0 lb

## 2018-03-19 DIAGNOSIS — R0683 Snoring: Secondary | ICD-10-CM | POA: Diagnosis not present

## 2018-03-19 NOTE — Patient Instructions (Signed)
Will arrange for home sleep study Will call to arrange for follow up after sleep study reviewed  

## 2018-03-19 NOTE — Progress Notes (Signed)
[facility]  Chief Complaint  Patient presents with  . Sleep Consult    Referred by Dr. Everlena Cooper for possible OSA. Patient's level of alertness has decreased.      Constitutional:  BP 128/82   Pulse 73   Ht 5\' 8"  (1.727 m)   Wt 216 lb (98 kg)   SpO2 98%   BMI 32.84 kg/m   Past Medical History:  Asthma, GERD, HTN, HA  Brief Summary:  Corey Bates is a 47 y.o. male with snoring.  He noticed having trouble with headaches for past 6 to 8 months.  He also is having trouble staying focused at work, and feeling more tired.  He was seen by Dr. Everlena Cooper with neurology.  There was concern he could have sleep apnea contributing to his symptoms.  His wife has told him that he snores, and will stop breathing at night.    He goes to sleep at 10 pm.  He falls asleep in 30 minutes.  He wakes up some times to use the bathroom.  He gets out of bed at 545 am.  He feels okay in the morning, but gets more tired as the day goes on.  He sometimes gets morning headache.  He does not use anything to help him fall sleep.  He has tried coffee to stay awake.  He used to wear a mouth guard to help with teeth grinding, but hasn't worn in years.  He denies sleep walking, sleep talking, or nightmares.  There is no history of restless legs.  He denies sleep hallucinations, sleep paralysis, or cataplexy.  The Epworth score is 11 out of 24.   Physical Exam:   Appearance - well kempt   ENMT - clear nasal mucosa, midline nasal  septum, no oral exudates, no LAN, trachea midline, MP 2, triangular uvula, decreased AP diameter  Respiratory - normal chest wall, normal respiratory effort, no accessory muscle use, no wheeze/rales  CV - s1s2 regular rate and rhythm, no murmurs, no peripheral edema, radial pulses symmetric  GI - soft, non tender, no masses  Lymph - no adenopathy noted in neck and axillary areas  MSK - normal gait  Ext - no cyanosis, clubbing, or joint  inflammation noted  Skin - no rashes, lesions, or ulcers  Neuro - normal strength, oriented x 3  Psych - normal mood and affect  Discussion:  He has snoring, sleep disruption, daytime sleepiness, and witnessed apnea.  He has history of hypertension and recurrent headaches.  I am concerned he could have sleep apnea.  Assessment/Plan:   Snoring with excessive daytime sleepiness. - will need to arrange for a home sleep study  Obesity. - discussed how weight can impact sleep and risk for sleep disordered breathing - discussed options to assist with weight loss: combination of diet modification, cardiovascular and strength training exercises  Cardiovascular risk. - had an extensive discussion regarding the adverse health consequences related to untreated sleep disordered breathing - specifically discussed the risks for hypertension, coronary artery disease, cardiac dysrhythmias, cerebrovascular disease, and diabetes - lifestyle modification discussed  Safe driving practices. - discussed how sleep disruption can increase risk of accidents, particularly when driving - safe driving practices were discussed  Therapies for obstructive sleep apnea. - if the sleep study shows significant sleep apnea, then various therapies for treatment were reviewed: CPAP, oral appliance, and surgical interventions   Patient Instructions  Will arrange for home sleep study Will call to arrange for follow  up after sleep study reviewed     Coralyn Helling, MD [facility] Pager: [phone] 03/19/2018, 4:29 PM  Flow Sheet    Sleep tests:    Review of Systems:  Reviewed and negative except in HPI.  Medications:   Allergies as of 03/19/2018      Reactions   Claritin [loratadine] Palpitations   tachnycardia   Tylenol [acetaminophen] Rash      Medication List        Accurate as of 03/19/18  4:29 PM. Always use your most recent med list.          acetaminophen 500  MG tablet Commonly known as:  TYLENOL Take 500 mg by mouth every 6 (six) hours as needed for mild pain.   DOXYLAMINE SUCCINATE (SLEEP) PO Take 1 tablet by mouth at bedtime as needed (sleep.).   omeprazole 20 MG capsule Commonly known as:  PRILOSEC Take 1 capsule by mouth daily.   PROAIR HFA 108 (90 Base) MCG/ACT inhaler Generic drug:  albuterol Inhale 2 puffs into the lungs every 4 (four) hours as needed.       Past Surgical History:  He  has a past surgical history that includes Mouth surgery and Hemorroidectomy.  Family History:  His family history includes Asthma in his father and mother; Heart attack in his maternal grandfather; Hypertension in his mother; Scoliosis in his mother, sister, and son.  Social History:  He  reports that he has never smoked. He has never used smokeless tobacco. He reports that he drinks alcohol.

## 2018-04-01 DIAGNOSIS — G4733 Obstructive sleep apnea (adult) (pediatric): Secondary | ICD-10-CM | POA: Diagnosis not present

## 2018-04-02 ENCOUNTER — Telehealth: Payer: Self-pay | Admitting: Pulmonary Disease

## 2018-04-02 ENCOUNTER — Encounter: Payer: Self-pay | Admitting: Pulmonary Disease

## 2018-04-02 DIAGNOSIS — G4733 Obstructive sleep apnea (adult) (pediatric): Secondary | ICD-10-CM | POA: Diagnosis not present

## 2018-04-02 HISTORY — DX: Obstructive sleep apnea (adult) (pediatric): G47.33

## 2018-04-02 NOTE — Telephone Encounter (Signed)
HST 04/01/18 >> AHI 15.1, SpO2 low 72%   Please let him know that the sleep study showed moderate obstructive sleep apnea.  Please arrange for ROV with a nurse practitioner to review treatment options.

## 2018-04-07 NOTE — Telephone Encounter (Signed)
Called and spoke with patient regarding results.  Informed the patient of results and recommendations today. Scheduled ov with B.Mack for 04/11/18 at 4pm Pt verbalized understanding and denied any questions or concerns at this time.  Nothing further needed.

## 2018-04-08 ENCOUNTER — Other Ambulatory Visit: Payer: Self-pay | Admitting: *Deleted

## 2018-04-08 DIAGNOSIS — R0683 Snoring: Secondary | ICD-10-CM

## 2018-04-11 ENCOUNTER — Encounter: Payer: Self-pay | Admitting: Nurse Practitioner

## 2018-04-11 ENCOUNTER — Ambulatory Visit: Payer: BLUE CROSS/BLUE SHIELD | Admitting: Nurse Practitioner

## 2018-04-11 VITALS — BP 128/86 | HR 88 | Ht 68.0 in | Wt 217.2 lb

## 2018-04-11 DIAGNOSIS — G4733 Obstructive sleep apnea (adult) (pediatric): Secondary | ICD-10-CM

## 2018-04-11 NOTE — Progress Notes (Deleted)
 @Patient  ID: Corey Bates, male    DOB: 01/25/1971, 47 y.o.   MRN: [id number]  No chief complaint on file.   Referring provider: Devra DoppHowell, Tamieka, MD  HPI:  47 year old male never smoker followed in our office for moderate obstructive sleep apnea  PMH: GERD, hypertension, asthma Smoker/ Smoking History: Never Smoker  Maintenance:   Pt of: Sood   04/11/2018  - Visit   HPI  Tests:  HST 04/01/18 >> AHI 15.1, SpO2 low 72%  Results of the Epworth flowsheet 03/19/2018  Sitting and reading 1  Watching TV 1  Sitting, inactive in a public place (e.g. a theatre or a meeting) 1  As a passenger in a car for an hour without a break 3  Lying down to rest in the afternoon when circumstances permit 3  Sitting and talking to someone 0  Sitting quietly after a lunch without alcohol 2  In a car, while stopped for a few minutes in traffic 0  Total score 11     FENO:  No results found for: NITRICOXIDE  PFT: No flowsheet data found.  Imaging: No results found.  Chart Review:    Specialty Problems      Pulmonary Problems   OSA (obstructive sleep apnea)      Allergies  Allergen Reactions  . Claritin [Loratadine] Palpitations    tachnycardia  . Tylenol [Acetaminophen] Rash     There is no immunization history on file for this patient.  Past Medical History:  Diagnosis Date  . Asthma   . GERD (gastroesophageal reflux disease)   . Hypertension   . OSA (obstructive sleep apnea) 04/02/2018    Tobacco History: Social History   Tobacco Use  Smoking Status Never Smoker  Smokeless Tobacco Never Used   Counseling given: Not Answered   Outpatient Encounter Medications as of 04/11/2018  Medication Sig  . acetaminophen (TYLENOL) 500 MG tablet Take 500 mg by mouth every 6 (six) hours as needed for mild pain.  Marland Kitchen. albuterol (PROAIR HFA) 108 (90 BASE) MCG/ACT inhaler Inhale 2 puffs into the lungs every 4 (four) hours as needed.  Marland Kitchen. DOXYLAMINE SUCCINATE, SLEEP, PO Take 1  tablet by mouth at bedtime as needed (sleep.).  Marland Kitchen. omeprazole (PRILOSEC) 20 MG capsule Take 1 capsule by mouth daily.   No facility-administered encounter medications on file as of 04/11/2018.      Review of Systems  Review of Systems   Physical Exam  There were no vitals taken for this visit.  Wt Readings from Last 5 Encounters:  03/19/18 216 lb (98 kg)  02/10/18 211 lb (95.7 kg)  01/28/14 201 lb (91.2 kg)  09/24/13 198 lb (89.8 kg)     Physical Exam    Lab Results:  CBC    Component Value Date/Time   WBC 4.6 10/28/2014 2355   RBC 5.80 10/28/2014 2355   HGB 14.6 10/28/2014 2355   HCT 45.3 10/28/2014 2355   PLT 244 10/28/2014 2355   MCV 78.1 10/28/2014 2355   MCH 25.2 (L) 10/28/2014 2355   MCHC 32.2 10/28/2014 2355   RDW 14.3 10/28/2014 2355    BMET    Component Value Date/Time   NA 138 10/28/2014 2355   K 4.2 10/28/2014 2355   CL 105 10/28/2014 2355   CO2 26 10/28/2014 2355   GLUCOSE 95 10/28/2014 2355   BUN 18 10/28/2014 2355   CREATININE 1.29 (H) 10/28/2014 2355   CALCIUM 9.2 10/28/2014 2355   GFRNONAA >60 10/28/2014 2355  GFRAA >60 10/28/2014 2355    BNP No results found for: BNP  ProBNP No results found for: PROBNP    Assessment & Plan:     No problem-specific Assessment & Plan notes found for this encounter.     Coral Ceo, NP 04/11/2018

## 2018-04-11 NOTE — Assessment & Plan Note (Signed)
Discussed sleep study - moderate sleep apnea Discussed effects of sleep apnea: Obesity. - discussed how weight can impact sleep and risk for sleep disordered breathing - discussed options to assist with weight loss: combination of diet modification, cardiovascular and strength training exercises   Cardiovascular risk. - had an extensive discussion regarding the adverse health consequences related to untreated sleep disordered breathing - specifically discussed the risks for hypertension, coronary artery disease, cardiac dysrhythmias, cerebrovascular disease, and diabetes - lifestyle modification discussed   Safe driving practices. - discussed how sleep disruption can increase risk of accidents, particularly when driving - safe driving practices were discussed   Therapies for obstructive sleep apnea. - if the sleep study shows significant sleep apnea, then various therapies for treatment were reviewed: CPAP, oral appliance, and surgical interventions  Patient Instructions  Will order CPAP with mask of choice  CPAP auto set to 5-15 cm H20 Continue current medications Goal of 4 hours or more usage per night Maintain healthy weight Do not drive if drowsy Follow up with Dr. Craige CottaSood or APP in 1 month after getting machine or sooner if needed

## 2018-04-11 NOTE — Progress Notes (Signed)
 @Patient  ID: Corey Bates, male    DOB: 06/26/1970, 47 y.o.   MRN: [id number]  Chief Complaint  Patient presents with  . Follow-up    Per pt, he is here to discuss his HST results.     Referring provider: Devra DoppHowell, Tamieka, MD  HPI 47 year old male never smoker followed in our office for moderate obstructive sleep apnea  PMH: GERD, hypertension, asthma Smoker/ Smoking History: Never Smoker  Maintenance:   Pt of: Sood  Tests:  HST 04/01/18 >> AHI 15.1, SpO2 low 72%  Results of the Epworth flowsheet 03/19/2018  Sitting and reading 1  Watching TV 1  Sitting, inactive in a public place (e.g. a theatre or a meeting) 1  As a passenger in a car for an hour without a break 3  Lying down to rest in the afternoon when circumstances permit 3  Sitting and talking to someone 0  Sitting quietly after a lunch without alcohol 2  In a car, while stopped for a few minutes in traffic 0  Total score 11    OV 04/11/18 - follow up OSA Patient presents for follow up after sleep study. He states that he has been doing well. States that his wife still witnesses him snoring heavy and having apneic episodes. He states that he is tired during the day and has trouble concentrating. He denies any nightmares or sleep walking. He denies any shortness of breath, chest pain, or edema.     Allergies  Allergen Reactions  . Claritin [Loratadine] Palpitations    tachnycardia  . Tylenol [Acetaminophen] Rash     There is no immunization history on file for this patient.  Past Medical History:  Diagnosis Date  . Asthma   . GERD (gastroesophageal reflux disease)   . Hypertension   . OSA (obstructive sleep apnea) 04/02/2018    Tobacco History: Social History   Tobacco Use  Smoking Status Never Smoker  Smokeless Tobacco Never Used   Counseling given: Yes   Outpatient Encounter Medications as of 04/11/2018  Medication Sig  . acetaminophen (TYLENOL) 500 MG tablet Take 500 mg by mouth every  6 (six) hours as needed for mild pain.  Marland Kitchen. albuterol (PROAIR HFA) 108 (90 BASE) MCG/ACT inhaler Inhale 2 puffs into the lungs every 4 (four) hours as needed.  Marland Kitchen. DOXYLAMINE SUCCINATE, SLEEP, PO Take 1 tablet by mouth at bedtime as needed (sleep.).  Marland Kitchen. levocetirizine (XYZAL) 5 MG tablet Take 5 mg by mouth every evening.  Marland Kitchen. omeprazole (PRILOSEC) 20 MG capsule Take 1 capsule by mouth daily.   No facility-administered encounter medications on file as of 04/11/2018.      Review of Systems  Review of Systems  Constitutional: Negative.  Negative for chills and fever.  HENT: Negative.  Negative for congestion, sinus pressure and sinus pain.   Respiratory: Negative for cough, shortness of breath and wheezing.   Cardiovascular: Negative.  Negative for chest pain, palpitations and leg swelling.  Gastrointestinal: Negative.   Allergic/Immunologic: Negative.   Neurological: Negative.   Psychiatric/Behavioral: Negative.        Physical Exam  BP 128/86 (BP Location: Right Arm, Patient Position: Sitting, Cuff Size: Normal)   Pulse 88   Ht 5\' 8"  (1.727 m)   Wt 217 lb 3.2 oz (98.5 kg)   SpO2 98%   BMI 33.03 kg/m   Wt Readings from Last 5 Encounters:  04/11/18 217 lb 3.2 oz (98.5 kg)  03/19/18 216 lb (98 kg)  02/10/18 211 lb (95.7 kg)  01/28/14 201 lb (91.2 kg)  09/24/13 198 lb (89.8 kg)     Physical Exam  Constitutional: He is oriented to person, place, and time. He appears well-developed and well-nourished. No distress.  Cardiovascular: Normal rate and regular rhythm.  Pulmonary/Chest: Effort normal and breath sounds normal. No respiratory distress. He has no wheezes.  Musculoskeletal: He exhibits no edema.  Neurological: He is alert and oriented to person, place, and time.  Skin: Skin is warm and dry.  Psychiatric: He has a normal mood and affect.  Nursing note and vitals reviewed.      Assessment & Plan:   OSA (obstructive sleep apnea) Discussed sleep study - moderate sleep  apnea Discussed effects of sleep apnea: Obesity. - discussed how weight can impact sleep and risk for sleep disordered breathing - discussed options to assist with weight loss: combination of diet modification, cardiovascular and strength training exercises   Cardiovascular risk. - had an extensive discussion regarding the adverse health consequences related to untreated sleep disordered breathing - specifically discussed the risks for hypertension, coronary artery disease, cardiac dysrhythmias, cerebrovascular disease, and diabetes - lifestyle modification discussed   Safe driving practices. - discussed how sleep disruption can increase risk of accidents, particularly when driving - safe driving practices were discussed   Therapies for obstructive sleep apnea. - if the sleep study shows significant sleep apnea, then various therapies for treatment were reviewed: CPAP, oral appliance, and surgical interventions  Patient Instructions  Will order CPAP with mask of choice  CPAP auto set to 5-15 cm H20 Continue current medications Goal of 4 hours or more usage per night Maintain healthy weight Do not drive if drowsy Follow up with Dr. Craige Cotta or APP in 1 month after getting machine or sooner if needed         Corey Andrew, NP 04/11/2018

## 2018-04-11 NOTE — Patient Instructions (Addendum)
Will order CPAP with mask of choice  CPAP auto set to 5-15 cm H20 Continue current medications Goal of 4 hours or more usage per night Maintain healthy weight Do not drive if drowsy Follow up with Dr. Craige CottaSood or APP in 1 month after getting machine or sooner if needed

## 2018-04-29 NOTE — Progress Notes (Signed)
Reviewed and agree with assessment/plan.   Steel Kerney, MD [facility] 05/16/2016, 12:24 PM Pager:  [phone]  

## 2018-05-02 ENCOUNTER — Ambulatory Visit (INDEPENDENT_AMBULATORY_CARE_PROVIDER_SITE_OTHER): Payer: BLUE CROSS/BLUE SHIELD

## 2018-05-02 ENCOUNTER — Other Ambulatory Visit: Payer: Self-pay | Admitting: Podiatry

## 2018-05-02 ENCOUNTER — Encounter: Payer: Self-pay | Admitting: Podiatry

## 2018-05-02 ENCOUNTER — Ambulatory Visit: Payer: BLUE CROSS/BLUE SHIELD | Admitting: Podiatry

## 2018-05-02 DIAGNOSIS — M779 Enthesopathy, unspecified: Secondary | ICD-10-CM

## 2018-05-02 DIAGNOSIS — M79672 Pain in left foot: Secondary | ICD-10-CM

## 2018-05-02 DIAGNOSIS — J45909 Unspecified asthma, uncomplicated: Secondary | ICD-10-CM | POA: Insufficient documentation

## 2018-05-02 DIAGNOSIS — M79671 Pain in right foot: Secondary | ICD-10-CM

## 2018-05-02 DIAGNOSIS — K219 Gastro-esophageal reflux disease without esophagitis: Secondary | ICD-10-CM | POA: Insufficient documentation

## 2018-05-02 MED ORDER — TRIAMCINOLONE ACETONIDE 10 MG/ML IJ SUSP
10.0000 mg | Freq: Once | INTRAMUSCULAR | Status: AC
  Administered 2018-05-02: 10 mg

## 2018-05-04 NOTE — Progress Notes (Signed)
Subjective:   Patient ID: Corey Bates, male   DOB: 47 y.o.   MRN: [id number]   HPI Patient presents stating he is developed pain in both of his ankles again and he noted his flatfoot deformity as part of it and his orthotics are no longer holding his arch up properly.  Patient states it is gradually getting worse and its not to the point where he is debilitated but it has intensified.  Patient does not smoke likes to be active   Review of Systems  All other systems reviewed and are negative.       Objective:  Physical Exam Vitals signs and nursing note reviewed.  Constitutional:      Appearance: He is well-developed.  Pulmonary:     Effort: Pulmonary effort is normal.  Musculoskeletal: Normal range of motion.  Skin:    General: Skin is warm.  Neurological:     Mental Status: He is alert.     Neurovascular status intact muscle strength is adequate range of motion within normal limits with diminished range of motion of the subtalar joint and severe flatfoot deformity noted bilateral with bulging of the talus and exquisite discomfort in the sinus tarsi bilateral.  Patient's found to have good digital perfusion well oriented x3     Assessment:  Significant flatfoot deformity noted bilateral with inflammation pain of the sinus tarsi bilateral and moderate posterior tibial tendinitis     Plan:  H&P x-rays reviewed and today I did inject the sinus tarsi bilateral 3 mg Kenalog 5 g Xylocaine and then scanned him with ped orthotist for customized orthotics to lift the arch and try to take stress off this foot with the understanding at one point in his life he may end up requiring some form of surgery.  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

## 2018-05-26 ENCOUNTER — Encounter: Payer: BLUE CROSS/BLUE SHIELD | Admitting: Orthotics

## 2018-05-26 ENCOUNTER — Telehealth: Payer: Self-pay | Admitting: Pulmonary Disease

## 2018-05-26 NOTE — Telephone Encounter (Signed)
Spoke to [place] at [facility].  She states she doesn't have the order.  I told her we sent the order over on 11/25 and we documented that we received message from [facility] that she received the order & we faxed sleep study because it was not scanned yet - it has been scanned now.  She spoke to [facility] & they are pulling order now.  Para March states she will call the pt today.  If there is a problem she will call our office back.  Called pt & made him aware Para March from [facility] should be calling him this evening.  Nothing further needed.

## 2018-05-26 NOTE — Telephone Encounter (Signed)
Spoke with pt, states that he has not yet been contacted about his new cpap machine.  This was ordered by our office on 04/11/18 and per the referral [facility] confirmed that they received this order.  [facility] please advise on status of this order.  Thanks!

## 2018-05-26 NOTE — Telephone Encounter (Signed)
I am calling [facility] to check on this. °

## 2018-05-26 NOTE — Telephone Encounter (Signed)
Order has been reprinted and faxed to number listed

## 2018-06-02 ENCOUNTER — Ambulatory Visit: Payer: BLUE CROSS/BLUE SHIELD | Admitting: Orthotics

## 2018-06-02 DIAGNOSIS — M779 Enthesopathy, unspecified: Secondary | ICD-10-CM

## 2018-06-02 DIAGNOSIS — M79671 Pain in right foot: Secondary | ICD-10-CM

## 2018-06-02 DIAGNOSIS — M79672 Pain in left foot: Secondary | ICD-10-CM

## 2018-06-02 NOTE — Progress Notes (Signed)
Patient came in today to pick up custom made foot orthotics.  The goals were accomplished and the patient reported no dissatisfaction with said orthotics.  Patient was advised of breakin period and how to report any issues. 

## 2018-06-03 ENCOUNTER — Encounter: Payer: BLUE CROSS/BLUE SHIELD | Admitting: Orthotics

## 2018-06-13 ENCOUNTER — Ambulatory Visit: Payer: BLUE CROSS/BLUE SHIELD | Admitting: Neurology

## 2018-07-29 NOTE — Progress Notes (Signed)
NEUROLOGY FOLLOW UP OFFICE NOTE  Cincere Givan [id number]  HISTORY OF PRESENT ILLNESS: Corey Bates is a 48 year old right-handed man with HTN, asthma and GERD who follows up for headaches.  UPDATE: He was referred to sleep medicine.  Sleep study in November did demonstrate obstructive sleep apnea.  He was ordered a CPAP and has been using it since mid-January.  He feels more alert.  Memory improved.  Reduction in headache frequency.  No longer with scalp soreness.    Intensity:  mild Duration:  4 hours if not taken anything. Frequency:  2 days a month Current NSAIDS:   Ibuprofen, naproxen (for feet pain) Current analgesics:  Tylenol Current triptans:  no Current ergotamine:  no Current anti-emetic:  no Current muscle relaxants:  no Current anti-anxiolytic:  no Current sleep aide:  no Current Antihypertensive medications:  no Current Antidepressant medications:  no Current Anticonvulsant medications:  no Current anti-CGRP:  no Current Vitamins/Herbal/Supplements:  no Current Antihistamines/Decongestants:  no Other therapy:  Red hibiscus tea  Caffeine:  At least 2 cups coffee daily Alcohol:  occasional Smoker:  no Diet:  Does not drink enough water, soda once a week Exercise:  no Depression:  no Anxiety:  yes Other pain:  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  Has a new dog that contributes to this.  Snores.  Daytime drowsiness.  HISTORY: Onset: January 2019.  Thought it was stress-related.  He had a headache lasting 2 weeks in July.  Since then, they are infrequent Location:  Right posterior parietal Quality:  pressure Initial intensity:  dull.  He denies new headache, thunderclap headache or severe headache that wakes him from sleep. Aura:  no Prodrome:  no Postdrome:  no Associated symptoms: Scalp soreness in the right parietal region.  He denies associated nausea, vomiting, photophobia, phonophobia, visual disturbance, and unilateral numbness or  weakness. Initial duration:  A few hours Initial Frequency:  1 day a week Treatment regimen:  Red hibiscus tea Frequency of abortive medication: None. Triggers: Hot weather, emotional stress Relieving factors: Rest Activity:  Needs to lay down  Past NSAIDS:  no Past analgesics:  Excedrin Migraine (did not feel well on it) Past abortive triptans:  no Past abortive ergotamine:  no Past muscle relaxants:  no Past anti-emetic:  no Past antihypertensive medications:  no Past antidepressant medications:  no Past anticonvulsant medications:  no Past anti-CGRP:  no Past vitamins/Herbal/Supplements:  no Past antihistamines/decongestants:  no Other past therapies:  no  Family history of headache:  No  PAST MEDICAL HISTORY: Past Medical History:  Diagnosis Date  . Asthma   . GERD (gastroesophageal reflux disease)   . Hypertension   . OSA (obstructive sleep apnea) 04/02/2018    MEDICATIONS: Current Outpatient Medications on File Prior to Visit  Medication Sig Dispense Refill  . acetaminophen (TYLENOL) 500 MG tablet Take 500 mg by mouth every 6 (six) hours as needed for mild pain.    Marland Kitchen albuterol (PROAIR HFA) 108 (90 BASE) MCG/ACT inhaler Inhale 2 puffs into the lungs every 4 (four) hours as needed.    Marland Kitchen DOXYLAMINE SUCCINATE, SLEEP, PO Take 1 tablet by mouth at bedtime as needed (sleep.).    Marland Kitchen levocetirizine (XYZAL) 5 MG tablet Take 5 mg by mouth every evening.    Marland Kitchen omeprazole (PRILOSEC) 20 MG capsule Take 1 capsule by mouth daily.  1   No current facility-administered medications on file prior to visit.     ALLERGIES: Allergies  Allergen Reactions  .  Claritin [Loratadine] Palpitations    tachnycardia  . Tylenol [Acetaminophen] Rash    FAMILY HISTORY: Family History  Problem Relation Age of Onset  . Asthma Mother   . Hypertension Mother   . Scoliosis Mother   . Asthma Father   . Scoliosis Sister   . Heart attack Maternal Grandfather   . Scoliosis Son    SOCIAL  HISTORY: Social History   Socioeconomic History  . Marital status: Married    Spouse name: Verlon Au  . Number of children: 2  . Years of education: Not on file  . Highest education level: 12th grade  Occupational History  . Occupation: Teaching laboratory technician: [employer]  Social Needs  . Financial resource strain: Not on file  . Food insecurity:    Worry: Not on file    Inability: Not on file  . Transportation needs:    Medical: Not on file    Non-medical: Not on file  Tobacco Use  . Smoking status: Never Smoker  . Smokeless tobacco: Never Used  Substance and Sexual Activity  . Alcohol use: Yes  . Drug use: Not on file  . Sexual activity: Not on file  Lifestyle  . Physical activity:    Days per week: Not on file    Minutes per session: Not on file  . Stress: Not on file  Relationships  . Social connections:    Talks on phone: Not on file    Gets together: Not on file    Attends religious service: Not on file    Active member of club or organization: Not on file    Attends meetings of clubs or organizations: Not on file    Relationship status: Not on file  . Intimate partner violence:    Fear of current or ex partner: Not on file    Emotionally abused: Not on file    Physically abused: Not on file    Forced sexual activity: Not on file  Other Topics Concern  . Not on file  Social History Narrative   Patient is right-handed. He lives with his wife and 2 sons in a one level home. He drinks 2 cups of soda a day and 1 soda a week. He does not exercise.    REVIEW OF SYSTEMS: Constitutional: No fevers, chills, or sweats, no generalized fatigue, change in appetite Eyes: No visual changes, double vision, eye pain Ear, nose and throat: No hearing loss, ear pain, nasal congestion, sore throat Cardiovascular: No chest pain, palpitations Respiratory:  No shortness of breath at rest or with exertion, wheezes GastrointestinaI: No nausea, vomiting, diarrhea, abdominal  pain, fecal incontinence Genitourinary:  No dysuria, urinary retention or frequency Musculoskeletal:  No neck pain, back pain Integumentary: No rash, pruritus, skin lesions Neurological: as above Psychiatric: No depression, insomnia, anxiety Endocrine: No palpitations, fatigue, diaphoresis, mood swings, change in appetite, change in weight, increased thirst Hematologic/Lymphatic:  No purpura, petechiae. Allergic/Immunologic: no itchy/runny eyes, nasal congestion, recent allergic reactions, rashes  PHYSICAL EXAM: Blood pressure 136/84, pulse 81, temperature 98.8 F (37.1 C), height  (1.727 m), weight 213 lb (96.6 kg), SpO2 96 %. General: No acute distress.  Patient appears well-groomed.   Head:  Normocephalic/atraumatic Eyes:  Fundi examined but not visualized Neck: supple, no paraspinal tenderness, full range of motion Heart:  Regular rate and rhythm Lungs:  Clear to auscultation bilaterally Back: No paraspinal tenderness Neurological Exam: alert and oriented to person, place, and time. Attention span and concentration  intact, recent and remote memory intact, fund of knowledge intact.  Speech fluent and not dysarthric, language intact.  CN II-XII intact. Bulk and tone normal, muscle strength 5/5 throughout.  Sensation to light touch intact.  Deep tendon reflexes 2+ throughout, toes downgoing.  Finger to nose and heel to shin testing intact.  Gait normal, Romberg negative.  IMPRESSION: 1.  Headache, likely tension-type and aggravated by uncontrolled OSA, now improved with use of CPAP 2.  Obstructive sleep apnea  PLAN: 1.  CPAP 2.  Over the counter analgesics as needed for acute headache, limited to no more than 2 days out of week to prevent rebound headache 3.  Follow up as needed.  15 minutes spent face to face with patient, over 50% spent discussing management.  Shon Millet, DO

## 2018-07-30 ENCOUNTER — Encounter

## 2018-07-30 ENCOUNTER — Encounter: Payer: Self-pay | Admitting: Neurology

## 2018-07-30 ENCOUNTER — Other Ambulatory Visit: Payer: Self-pay

## 2018-07-30 ENCOUNTER — Ambulatory Visit: Payer: BLUE CROSS/BLUE SHIELD | Admitting: Neurology

## 2018-07-30 VITALS — BP 136/84 | HR 81 | Temp 98.8°F | Ht 68.0 in | Wt 213.0 lb

## 2018-07-30 DIAGNOSIS — R51 Headache: Secondary | ICD-10-CM

## 2018-07-30 DIAGNOSIS — G4733 Obstructive sleep apnea (adult) (pediatric): Secondary | ICD-10-CM | POA: Diagnosis not present

## 2018-07-30 DIAGNOSIS — R519 Headache, unspecified: Secondary | ICD-10-CM

## 2018-07-30 NOTE — Patient Instructions (Signed)
1.  Continue use of CPAP 2.  Ibuprofen, naproxen, acetaminophen or Excedrin for headache.  Limit use of pain relievers to no more than 2 days out of week to prevent risk of rebound or medication-overuse headache. 3.  Follow up as needed.

## 2018-10-15 ENCOUNTER — Ambulatory Visit: Payer: BLUE CROSS/BLUE SHIELD | Admitting: Neurology

## 2021-08-22 ENCOUNTER — Other Ambulatory Visit: Payer: Self-pay

## 2021-08-22 ENCOUNTER — Emergency Department (HOSPITAL_COMMUNITY)
Admission: EM | Admit: 2021-08-22 | Discharge: 2021-08-22 | Disposition: A | Discharge status: Home / Self Care | Payer: BC Managed Care – PPO | Attending: Emergency Medicine | Admitting: Emergency Medicine

## 2021-08-22 ENCOUNTER — Encounter (HOSPITAL_COMMUNITY): Payer: Self-pay

## 2021-08-22 ENCOUNTER — Emergency Department (HOSPITAL_COMMUNITY): Payer: BC Managed Care – PPO

## 2021-08-22 DIAGNOSIS — J45909 Unspecified asthma, uncomplicated: Secondary | ICD-10-CM | POA: Diagnosis not present

## 2021-08-22 DIAGNOSIS — R0789 Other chest pain: Secondary | ICD-10-CM | POA: Diagnosis not present

## 2021-08-22 DIAGNOSIS — R42 Dizziness and giddiness: Secondary | ICD-10-CM | POA: Diagnosis present

## 2021-08-22 DIAGNOSIS — I10 Essential (primary) hypertension: Secondary | ICD-10-CM | POA: Diagnosis not present

## 2021-08-22 LAB — CBC
HCT: 47.8 % (ref 39.0–52.0)
Hemoglobin: 15 g/dL (ref 13.0–17.0)
MCH: 24.8 pg — ABNORMAL LOW (ref 26.0–34.0)
MCHC: 31.4 g/dL (ref 30.0–36.0)
MCV: 79.1 fL — ABNORMAL LOW (ref 80.0–100.0)
Platelets: 276 10*3/uL (ref 150–400)
RBC: 6.04 MIL/uL — ABNORMAL HIGH (ref 4.22–5.81)
RDW: 14.9 % (ref 11.5–15.5)
WBC: 5 10*3/uL (ref 4.0–10.5)
nRBC: 0 % (ref 0.0–0.2)

## 2021-08-22 LAB — BASIC METABOLIC PANEL
Anion gap: 8 (ref 5–15)
BUN: 20 mg/dL (ref 6–20)
CO2: 24 mmol/L (ref 22–32)
Calcium: 9.7 mg/dL (ref 8.9–10.3)
Chloride: 108 mmol/L (ref 98–111)
Creatinine, Ser: 1 mg/dL (ref 0.61–1.24)
GFR, Estimated: >60 mL/min (ref >60)
Glucose, Bld: 107 mg/dL — ABNORMAL HIGH (ref 70–99)
Potassium: 3.9 mmol/L (ref 3.5–5.1)
Sodium: 140 mmol/L (ref 135–145)

## 2021-08-22 LAB — TROPONIN I (HIGH SENSITIVITY)
Troponin I (High Sensitivity): 3 ng/L (ref <18)
Troponin I (High Sensitivity): 3 ng/L (ref <18)

## 2021-08-22 MED ORDER — MECLIZINE HCL 25 MG PO TABS
25.0000 mg | ORAL_TABLET | Freq: Once | ORAL | Status: AC
  Administered 2021-08-22: 25 mg via ORAL
  Filled 2021-08-22: qty 1

## 2021-08-22 MED ORDER — MECLIZINE HCL 25 MG PO TABS: 25.0000 mg | ORAL_TABLET | Freq: Three times a day (TID) | ORAL | 0 refills | Status: AC | PRN

## 2021-08-22 MED ORDER — ASPIRIN 81 MG PO CHEW
324.0000 mg | CHEWABLE_TABLET | Freq: Once | ORAL | Status: AC
Start: 2021-08-22 — End: 2021-08-22
  Administered 2021-08-22: 324 mg via ORAL
  Filled 2021-08-22: qty 4

## 2021-08-22 NOTE — ED Provider Notes (Signed)
Signed to me by Dr. Read Drivers pending results of repeat troponin which was negative.  Suspect that patient has vertigo.  Low suspicion for ACS.  Will discharge on Antivert ?  ?Lorre Nick, MD ?08/22/21 [phone] ? ?

## 2021-08-22 NOTE — ED Triage Notes (Signed)
Pt states that he woke up this morning sweating, dizzy, and having chest tightness in his left chest area. Pt reports wanting to feel like he had to throw up and states that his heart rate was high.  ?

## 2021-08-22 NOTE — ED Notes (Signed)
Save dark green and blue tube in main lab 

## 2021-08-22 NOTE — ED Provider Notes (Signed)
? ?[facility] DEPT ?Provider Note: Georgena Spurling, MD, FACEP ? ?CSN: [id number] ?MRN: [id number] ?ARRIVAL: 08/22/21 at [place] ?ROOM: [facility] ? ? ?CHIEF COMPLAINT  ?Dizziness ? ? ?HISTORY OF PRESENT ILLNESS  ?08/22/21 5:27 AM ?Corey Bates is a 51 y.o. male who awakened about 4 AM with the need to go to the bathroom.  He realized that he was dizzy, meaning the room felt like it was moving.  Symptoms are worse when he moves his head especially when he got up to walk to the bathroom.  This is associated with nausea, a cold sweat, palpitations (rapid heartbeat) and a mild pressure-like sensation below his left nipple.  He had no shortness of breath and he did not vomit.  The symptoms only lasted a few minutes.  They recurred, but more mildly, as he was walking into the ED.  He is not having symptoms presently. ? ? ?Past Medical History:  ?Diagnosis Date  ? Asthma   ? GERD (gastroesophageal reflux disease)   ? Hypertension   ? OSA (obstructive sleep apnea) 04/02/2018  ? ? ?Past Surgical History:  ?Procedure Laterality Date  ? HEMORROIDECTOMY    ? MOUTH SURGERY    ? ? ?Family History  ?Problem Relation Age of Onset  ? Asthma Mother   ? Hypertension Mother   ? Scoliosis Mother   ? Asthma Father   ? Scoliosis Sister   ? Heart attack Maternal Grandfather   ? Scoliosis Son   ? ? ?Social History  ? ?Tobacco Use  ? Smoking status: Never  ? Smokeless tobacco: Never  ?Substance Use Topics  ? Alcohol use: Yes  ? ? ?Prior to Admission medications   ?Medication Sig Start Date End Date Taking? Authorizing Provider  ?acetaminophen (TYLENOL) 500 MG tablet Take 500 mg by mouth every 6 (six) hours as needed for mild pain.    [provider]  ?albuterol (PROAIR HFA) 108 (90 BASE) MCG/ACT inhaler Inhale 2 puffs into the lungs every 4 (four) hours as needed. 04/30/14   [provider]  ?DOXYLAMINE SUCCINATE, SLEEP, PO Take 1 tablet by mouth at bedtime as needed (sleep.).    [provider]  ?levocetirizine (XYZAL)  5 MG tablet Take 5 mg by mouth every evening.    [provider]  ?omeprazole (PRILOSEC) 20 MG capsule Take 1 capsule by mouth daily. 08/06/14   [provider]  ? ? ?Allergies ?Claritin [loratadine] and Tylenol [acetaminophen] ? ? ?REVIEW OF SYSTEMS  ?Negative except as noted here or in the History of Present Illness. ? ? ?PHYSICAL EXAMINATION  ?Initial Vital Signs ?Blood pressure (!) 153/100, pulse 77, temperature 98.4 ?F (36.9 ?C), temperature source Oral, resp. rate 18, height 5\' 7"  (1.702 m), weight 99.3 kg, SpO2 100 %. ? ?Examination ?General: Well-developed, well-nourished male in no acute distress; appearance consistent with age of record ?HENT: normocephalic; atraumatic ?Eyes: pupils equal, round; extraocular muscles intact; no nystagmus ?Neck: supple ?Heart: regular rate and rhythm ?Lungs: clear to auscultation bilaterally ?Abdomen: soft; nondistended; nontender; bowel sounds present ?Extremities: No deformity; full range of motion; pulses normal ?Neurologic: Awake, alert and oriented; motor function intact in all extremities and symmetric; no facial droop ?Skin: Warm and dry ?Psychiatric: Normal mood and affect ? ? ?RESULTS  ?Summary of this visit's results, reviewed and interpreted by myself: ? ? EKG Interpretation ? ?Date/Time:  Tuesday August 22 2021 05:19:13 EDT ?Ventricular Rate:  76 ?PR Interval:  160 ?QRS Duration: 95 ?QT Interval:  408 ?QTC Calculation: 459 ?R Axis:   -  90 ?Text Interpretation: Sinus rhythm Left anterior fascicular block Abnormal R-wave progression, early transition No significant change was found Confirmed by Jessye Imhoff [phone]) on 08/22/2021 5:26:30 AM ?  ? ?  ? ?Laboratory Studies: ?Results for orders placed or performed during the hospital encounter of 08/22/21 (from the past 24 hour(s))  ?Basic metabolic panel     Status: Abnormal  ? Collection Time: 08/22/21  5:11 AM  ?Result Value Ref Range  ? Sodium 140 135 - 145 mmol/L  ? Potassium 3.9 3.5 - 5.1 mmol/L  ?  Chloride 108 98 - 111 mmol/L  ? CO2 24 22 - 32 mmol/L  ? Glucose, Bld 107 (H) 70 - 99 mg/dL  ? BUN 20 6 - 20 mg/dL  ? Creatinine, Ser 1.00 0.61 - 1.24 mg/dL  ? Calcium 9.7 8.9 - 10.3 mg/dL  ? GFR, Estimated >60 >60 mL/min  ? Anion gap 8 5 - 15  ?CBC     Status: Abnormal  ? Collection Time: 08/22/21  5:11 AM  ?Result Value Ref Range  ? WBC 5.0 4.0 - 10.5 K/uL  ? RBC 6.04 (H) 4.22 - 5.81 MIL/uL  ? Hemoglobin 15.0 13.0 - 17.0 g/dL  ? HCT 47.8 39.0 - 52.0 %  ? MCV 79.1 (L) 80.0 - 100.0 fL  ? MCH 24.8 (L) 26.0 - 34.0 pg  ? MCHC 31.4 30.0 - 36.0 g/dL  ? RDW 14.9 11.5 - 15.5 %  ? Platelets 276 150 - 400 K/uL  ? nRBC 0.0 0.0 - 0.2 %  ?Troponin I (High Sensitivity)     Status: None  ? Collection Time: 08/22/21  5:11 AM  ?Result Value Ref Range  ? Troponin I (High Sensitivity) 3 <18 ng/L  ? ?Imaging Studies: ?DG Chest 2 View ? ?Result Date: 08/22/2021 ?CLINICAL DATA:  Sudden onset chest pain with nausea EXAM: CHEST - 2 VIEW COMPARISON:  10/29/2014 FINDINGS: Generous heart size accentuated by low volume frontal view. Normal appearance of the heart size on the lateral view. Unremarkable mediastinal contours. There is no edema, consolidation, effusion, or pneumothorax. Artifact from EKG leads. IMPRESSION: Negative low volume chest. Electronically Signed   By: Jorje Guild M.D.   On: 08/22/2021 05:54   ? ?ED COURSE and MDM  ?Nursing notes, initial and subsequent vitals signs, including pulse oximetry, reviewed and interpreted by myself. ? ?Vitals:  ? 08/22/21 0513 08/22/21 0520 08/22/21 0630  ?BP: (!) 153/100  (!) 146/98  ?Pulse: 77  73  ?Resp:  18 19  ?Temp: 98.4 ?F (36.9 ?C)    ?TempSrc: Oral    ?SpO2: 100%  100%  ?Weight: 99.3 kg    ?Height: 5\' 7"  (1.702 m)    ? ?Medications  ?meclizine (ANTIVERT) tablet 25 mg (25 mg Oral Given 08/22/21 0556)  ?aspirin chewable tablet 324 mg (324 mg Oral Given 08/22/21 0555)  ? ?6:11 AM ?Patient's presentation is more consistent with peripheral vertigo than a primary cardiac event.  A cardiac  work-up is ongoing due to the patient's chest discomfort.  He was given Antivert for treatment of presumed vertigo.  ? ?7:19 AM ?Signed out to Dr. Zenia Resides who will follow up in second troponin and make disposition. ? ? ?PROCEDURES  ?Procedures ? ? ?ED DIAGNOSES  ? ?  ICD-10-CM   ?1. Vertigo  R42   ?  ?2. Atypical chest pain  R07.89   ?  ? ? ? ?  ?Shanon Rosser, MD ?08/22/21 0720 ? ?

## 2023-01-04 IMAGING — CR DG CHEST 2V
2 series · 2 of 2 positions shown · non-contrast
Comparison: 10/29/2014

CLINICAL DATA: Sudden onset chest pain with nausea

EXAM:
CHEST - 2 VIEW

[w chest lat]
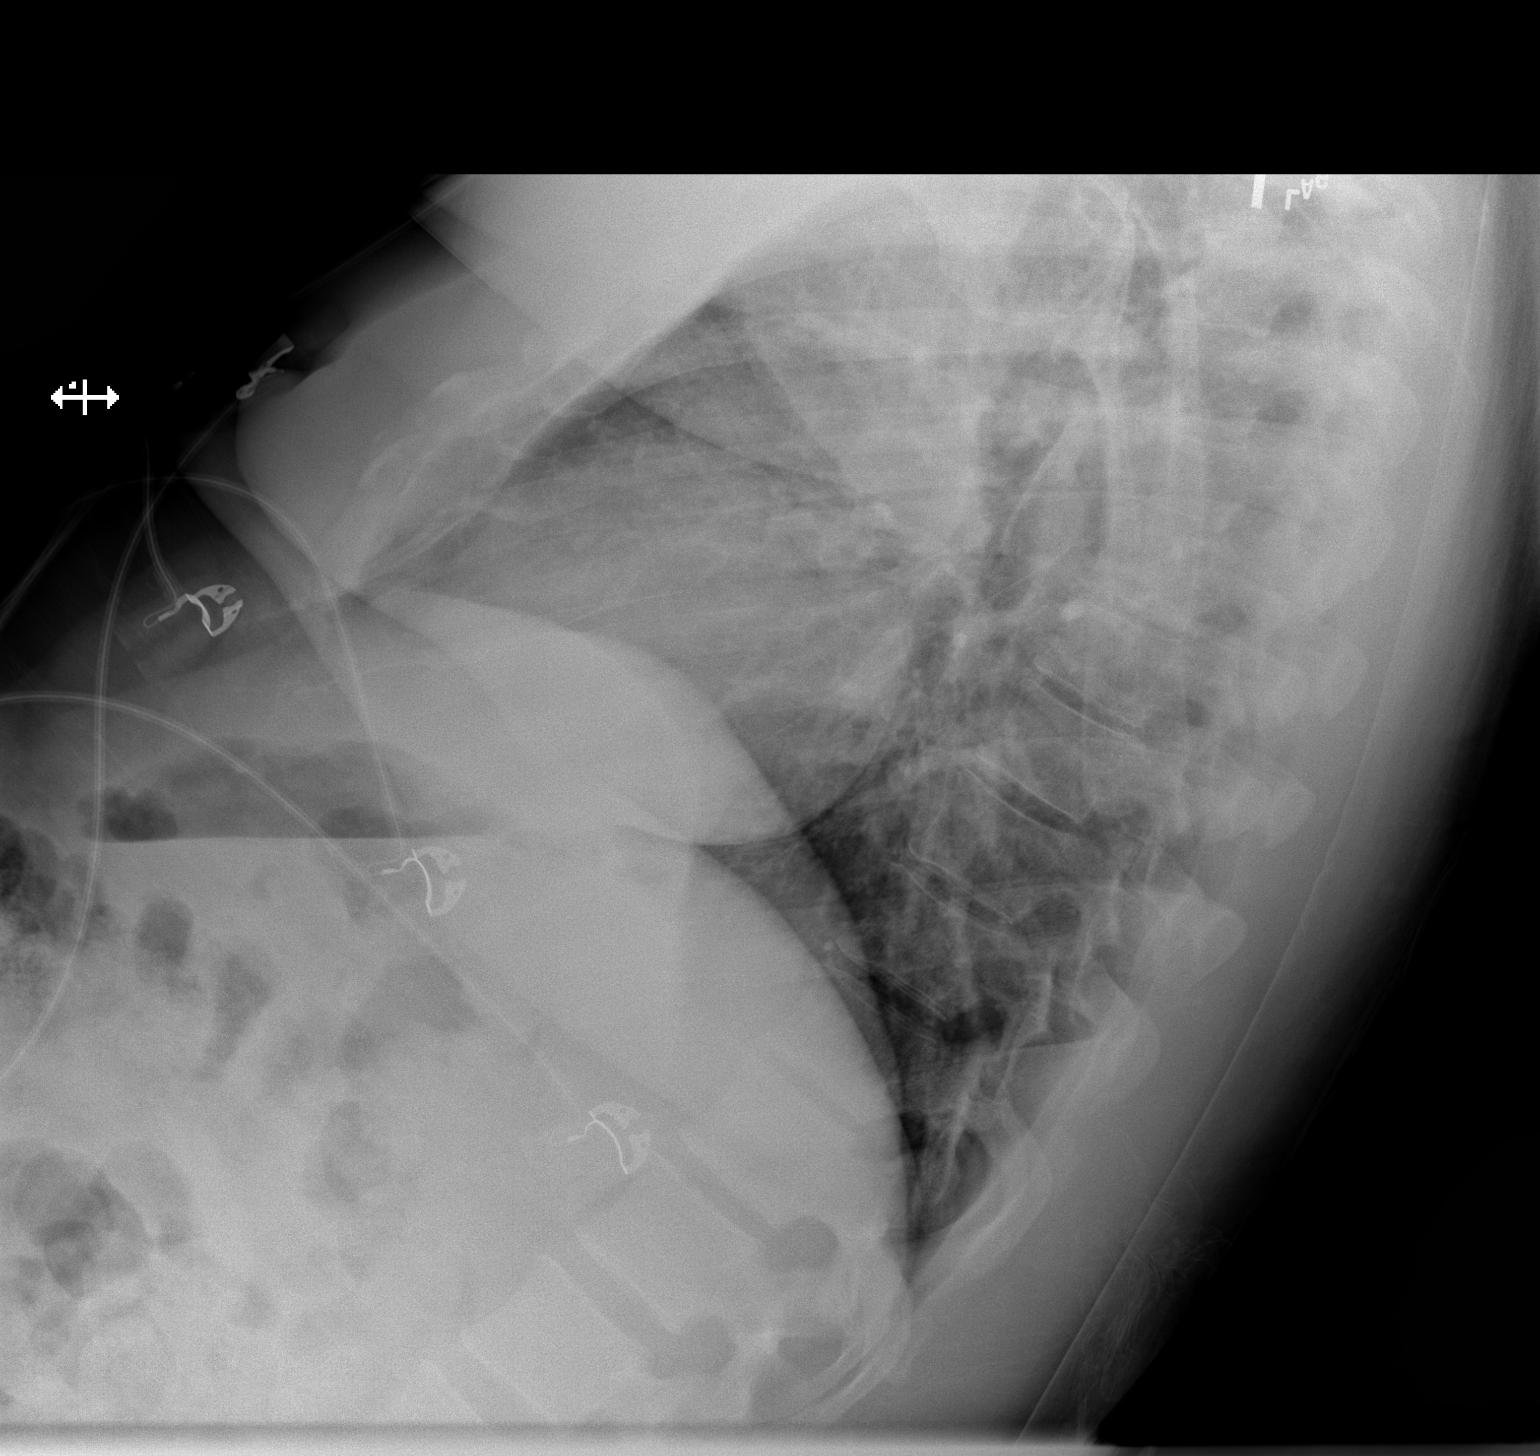

[x chest ap]
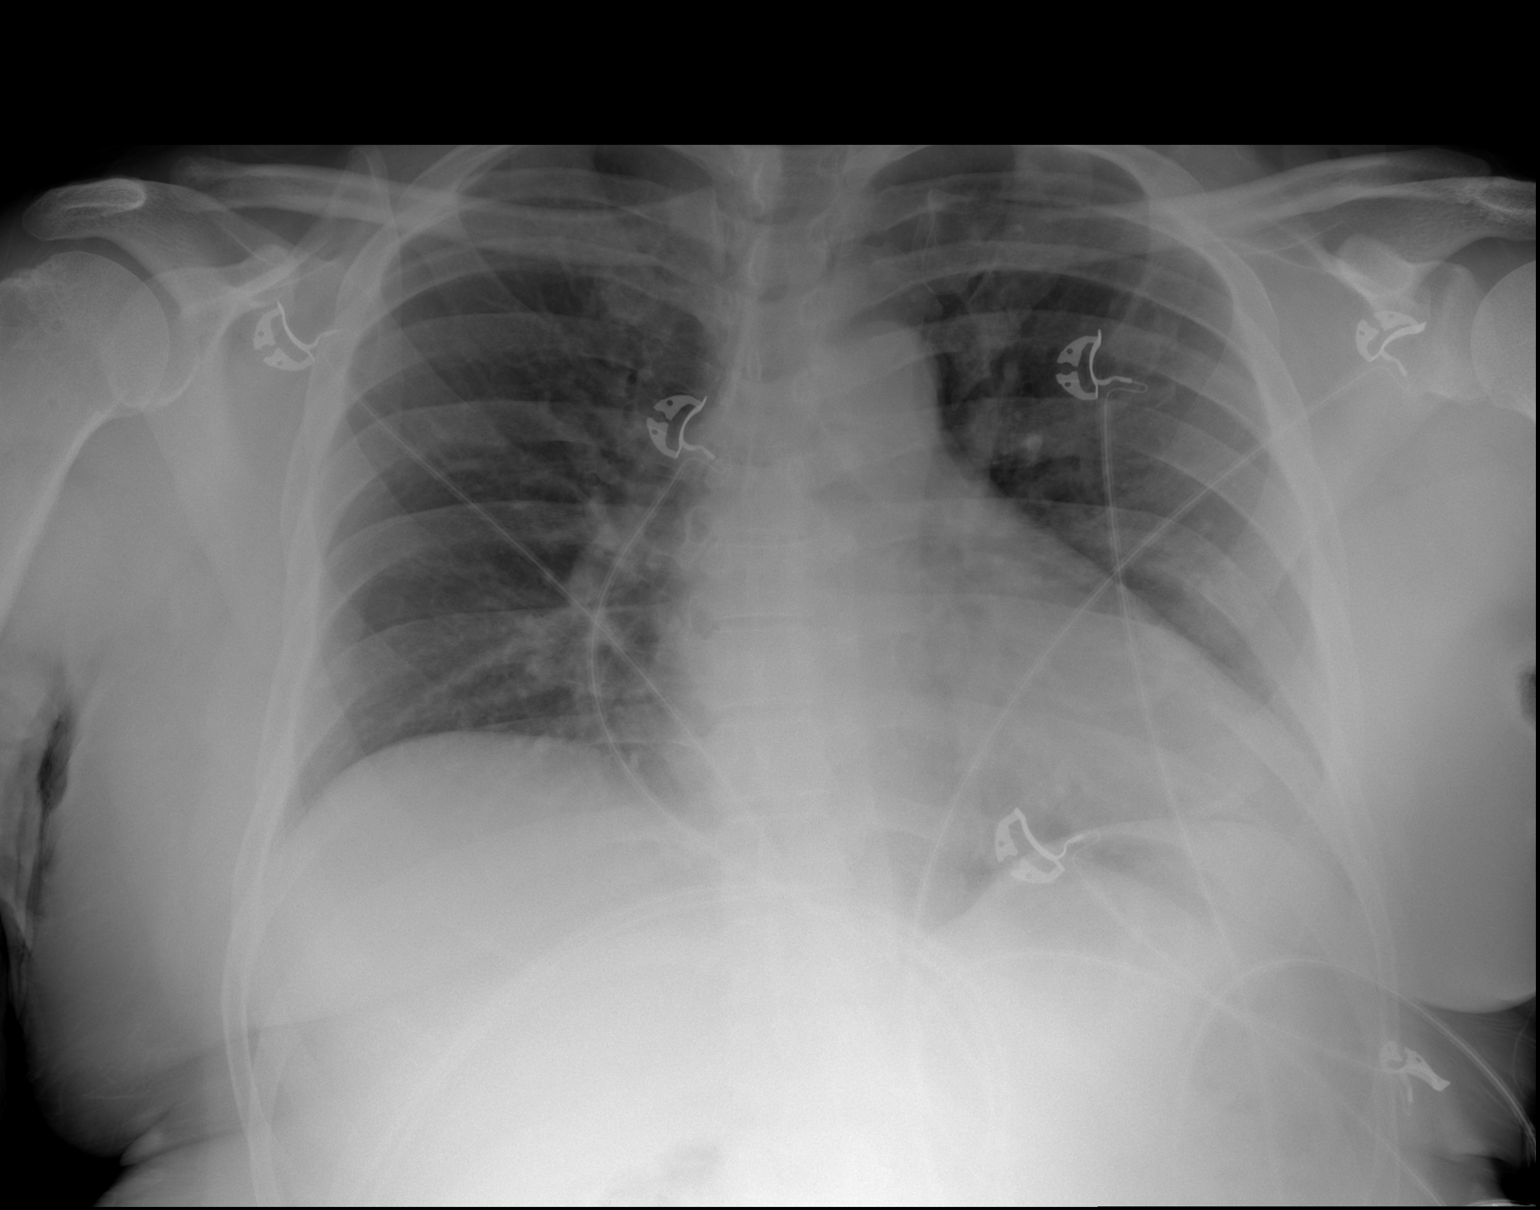

[2 of 2 positions shown; findings below may reference images not displayed]

FINDINGS: Generous heart size accentuated by low volume frontal view. Normal
appearance of the heart size on the lateral view. Unremarkable
mediastinal contours. There is no edema, consolidation, effusion, or
pneumothorax. Artifact from EKG leads.
IMPRESSION: Negative low volume chest.

## 2023-06-25 NOTE — Progress Notes (Signed)
PUO
